# Patient Record
Sex: Female | Born: 1995 | Race: Black or African American | Hispanic: No | Smoking: Never smoker
Health system: Southern US, Community
[De-identification: ages and names within clinical notes are randomized; demographics above are authoritative.]

## PROBLEM LIST (undated history)

## (undated) ENCOUNTER — Inpatient Hospital Stay (HOSPITAL_COMMUNITY): Payer: Self-pay

## (undated) DIAGNOSIS — O139 Gestational [pregnancy-induced] hypertension without significant proteinuria, unspecified trimester: Secondary | ICD-10-CM

## (undated) DIAGNOSIS — F419 Anxiety disorder, unspecified: Secondary | ICD-10-CM

## (undated) DIAGNOSIS — Z87820 Personal history of traumatic brain injury: Secondary | ICD-10-CM

## (undated) DIAGNOSIS — R87629 Unspecified abnormal cytological findings in specimens from vagina: Secondary | ICD-10-CM

## (undated) DIAGNOSIS — R519 Headache, unspecified: Secondary | ICD-10-CM

## (undated) HISTORY — PX: ARTHROSCOPIC REPAIR ACL: SUR80

## (undated) HISTORY — DX: Headache, unspecified: R51.9

## (undated) HISTORY — DX: Gestational (pregnancy-induced) hypertension without significant proteinuria, unspecified trimester: O13.9

## (undated) HISTORY — DX: Unspecified abnormal cytological findings in specimens from vagina: R87.629

## (undated) HISTORY — DX: Personal history of traumatic brain injury: Z87.820

---
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
## 2016-03-06 ENCOUNTER — Encounter: Payer: Self-pay | Admitting: Pediatrics

## 2016-06-13 ENCOUNTER — Other Ambulatory Visit (HOSPITAL_COMMUNITY)
Admission: RE | Admit: 2016-06-13 | Discharge: 2016-06-13 | Disposition: A | Discharge status: Home / Self Care | Payer: Self-pay | Source: Ambulatory Visit | Attending: Obstetrics & Gynecology | Admitting: Obstetrics & Gynecology

## 2016-06-13 ENCOUNTER — Ambulatory Visit (INDEPENDENT_AMBULATORY_CARE_PROVIDER_SITE_OTHER): Payer: Medicaid Other | Admitting: Obstetrics & Gynecology

## 2016-06-13 ENCOUNTER — Encounter: Payer: Self-pay | Admitting: Obstetrics & Gynecology

## 2016-06-13 VITALS — BP 120/80 | HR 80 | Ht 62.0 in | Wt 165.0 lb

## 2016-06-13 DIAGNOSIS — Z308 Encounter for other contraceptive management: Secondary | ICD-10-CM

## 2016-06-13 DIAGNOSIS — Z113 Encounter for screening for infections with a predominantly sexual mode of transmission: Secondary | ICD-10-CM | POA: Insufficient documentation

## 2016-06-13 DIAGNOSIS — Z01419 Encounter for gynecological examination (general) (routine) without abnormal findings: Secondary | ICD-10-CM

## 2016-06-13 DIAGNOSIS — Z3202 Encounter for pregnancy test, result negative: Secondary | ICD-10-CM

## 2016-06-13 LAB — POCT URINE PREGNANCY: Preg Test, Ur: NEGATIVE

## 2016-06-13 MED ORDER — FLUCONAZOLE 150 MG PO TABS: 150.0000 mg | ORAL_TABLET | Freq: Once | ORAL | 0 refills | Status: AC

## 2016-06-13 NOTE — Progress Notes (Signed)
Subjective:     Lauren Guzman is a 20 y.o. female here for a routine exam.  Patient's last menstrual period was 05/23/2016. No obstetric history on file. Birth Control Method:  none Menstrual Calendar(currently): regular  Current complaints: none.   Current acute medical issues:  none   Recent Gynecologic History Patient's last menstrual period was 05/23/2016. Last Pap: never,   Last mammogram: ,    History reviewed. No pertinent past medical history.  History reviewed. No pertinent surgical history.  OB History    No data available      Social History   Social History  . Marital status: Single    Spouse name: N/A  . Number of children: N/A  . Years of education: N/A   Social History Main Topics  . Smoking status: Never Smoker  . Smokeless tobacco: Never Used  . Alcohol use No  . Drug use: No  . Sexual activity: Yes   Other Topics Concern  . None   Social History Narrative  . None    Family History  Problem Relation Age of Onset  . Diabetes Father   . Hypertension Father      Current Outpatient Prescriptions:  .  fluconazole (DIFLUCAN) 150 MG tablet, Take 1 tablet (150 mg total) by mouth once. Take the second tablet 3 days after the first one., Disp: 2 tablet, Rfl: 0  Review of Systems  Review of Systems  Constitutional: Negative for fever, chills, weight loss, malaise/fatigue and diaphoresis.  HENT: Negative for hearing loss, ear pain, nosebleeds, congestion, sore throat, neck pain, tinnitus and ear discharge.   Eyes: Negative for blurred vision, double vision, photophobia, pain, discharge and redness.  Respiratory: Negative for cough, hemoptysis, sputum production, shortness of breath, wheezing and stridor.   Cardiovascular: Negative for chest pain, palpitations, orthopnea, claudication, leg swelling and PND.  Gastrointestinal: negative for abdominal pain. Negative for heartburn, nausea, vomiting, diarrhea, constipation, blood in stool and melena.   Genitourinary: Negative for dysuria, urgency, frequency, hematuria and flank pain.  Musculoskeletal: Negative for myalgias, back pain, joint pain and falls.  Skin: Negative for itching and rash.  Neurological: Negative for dizziness, tingling, tremors, sensory change, speech change, focal weakness, seizures, loss of consciousness, weakness and headaches.  Endo/Heme/Allergies: Negative for environmental allergies and polydipsia. Does not bruise/bleed easily.  Psychiatric/Behavioral: Negative for depression, suicidal ideas, hallucinations, memory loss and substance abuse. The patient is not nervous/anxious and does not have insomnia.        Objective:  Blood pressure 120/80, pulse 80, height 5\' 2"  (1.575 m), weight 165 lb (74.8 kg), last menstrual period 05/23/2016.   Physical Exam  Vitals reviewed. Constitutional: She is oriented to person, place, and time. She appears well-developed and well-nourished.  HENT:  Head: Normocephalic and atraumatic.        Right Ear: External ear normal.  Left Ear: External ear normal.  Nose: Nose normal.  Mouth/Throat: Oropharynx is clear and moist.  Eyes: Conjunctivae and EOM are normal. Pupils are equal, round, and reactive to light. Right eye exhibits no discharge. Left eye exhibits no discharge. No scleral icterus.  Neck: Normal range of motion. Neck supple. No tracheal deviation present. No thyromegaly present.  Cardiovascular: Normal rate, regular rhythm, normal heart sounds and intact distal pulses.  Exam reveals no gallop and no friction rub.   No murmur heard. Respiratory: Effort normal and breath sounds normal. No respiratory distress. She has no wheezes. She has no rales. She exhibits no tenderness.  GI: Soft.  Bowel sounds are normal. She exhibits no distension and no mass. There is no tenderness. There is no rebound and no guarding.  Genitourinary:  Breasts no masses skin changes or nipple changes bilaterally      Vulva is normal without  lesions Vagina is pink moist with yeast vaginitis Cervix normal in appearance and pap is done Uterus is normal size shape and contour Adnexa is negative with normal sized ovaries   Musculoskeletal: Normal range of motion. She exhibits no edema and no tenderness.  Neurological: She is alert and oriented to person, place, and time. She has normal reflexes. She displays normal reflexes. No cranial nerve deficit. She exhibits normal muscle tone. Coordination normal.  Skin: Skin is warm and dry. No rash noted. No erythema. No pallor.  Psychiatric: She has a normal mood and affect. Her behavior is normal. Judgment and thought content normal.       Medications Ordered at today's visit: Meds ordered this encounter  Medications  . fluconazole (DIFLUCAN) 150 MG tablet    Sig: Take 1 tablet (150 mg total) by mouth once. Take the second tablet 3 days after the first one.    Dispense:  2 tablet    Refill:  0    Other orders placed at today's visit: Orders Placed This Encounter  Procedures  . POCT urine pregnancy      Assessment:    Healthy female exam.   yeast vaginitis Plan:    we discussed birth control options at some length, she has done Depo and pills before but wants the ease of use of depo.  We discussed Nexplanon and she had not heard of that before.  She likes the idea of 3 year coverage, as she is a Holiday representative in college  Will order nexplanon and pt will call on her menses to schedule, she is free from college on Fridays She is instructed not to have intercourse after she starts her next period until the nexplanon     Return if symptoms worsen or fail to improve, for pt will call on her menses to have nexplanon placed, with Dr Despina Hidden.

## 2016-06-13 NOTE — Addendum Note (Signed)
Addended by: Federico FlakeNES, Sophea Rackham A on: 06/13/2016 11:47 AM   Modules accepted: Orders

## 2016-06-13 NOTE — Addendum Note (Signed)
Addended by: Federico FlakeNES, Nabilah Davoli A on: 06/13/2016 11:53 AM   Modules accepted: Orders

## 2016-06-17 LAB — CYTOLOGY - PAP

## 2016-07-09 ENCOUNTER — Ambulatory Visit (INDEPENDENT_AMBULATORY_CARE_PROVIDER_SITE_OTHER): Payer: Medicaid Other | Admitting: Women's Health

## 2016-07-09 ENCOUNTER — Other Ambulatory Visit: Payer: Medicaid Other

## 2016-07-09 ENCOUNTER — Encounter: Payer: Self-pay | Admitting: Women's Health

## 2016-07-09 VITALS — BP 118/62 | HR 72 | Wt 172.0 lb

## 2016-07-09 DIAGNOSIS — Z308 Encounter for other contraceptive management: Secondary | ICD-10-CM | POA: Diagnosis not present

## 2016-07-09 DIAGNOSIS — Z30017 Encounter for initial prescription of implantable subdermal contraceptive: Secondary | ICD-10-CM | POA: Insufficient documentation

## 2016-07-09 LAB — BETA HCG QUANT (REF LAB)

## 2016-07-09 NOTE — Progress Notes (Signed)
[name] is a 20 y.o. year old [place] female here for Nexplanon insertion.  Patient's last menstrual period was 06/23/2016., last sexual intercourse was 06/16/16, and her pregnancy test today was negative.  Risks/benefits/side effects of Nexplanon have been discussed and her questions have been answered.  Specifically, a failure rate of 09/998 has been reported, with an increased failure rate if pt takes St. John's Wort and/or antiseizure medicaitons.  [name] is aware of the common side effect of irregular bleeding, which the incidence of decreases over time.  BP 118/62 (BP Location: Right Arm, Patient Position: Sitting, Cuff Size: Normal)   Pulse 72   Wt 172 lb (78 kg)   LMP 06/23/2016   BMI 31.46 kg/m   Results for orders placed or performed in visit on 07/09/16 (from the past 24 hour(s))  Beta HCG, Quant   Collection Time: 07/09/16  9:02 AM  Result Value Ref Range   hCG Quant <1 mIU/mL     She is right-handed, so her left arm, approximately 4 inches proximal from the elbow, was cleansed with alcohol and anesthetized with 2cc of 2% Lidocaine.  The area was cleansed again with betadine and the Nexplanon was inserted per manufacturer's recommendations without difficulty.  A steri-strip and pressure bandage were applied.  Pt was instructed to keep the area clean and dry, remove pressure bandage in 24 hours, and keep insertion site covered with the steri-strip for 3-5 days.  Back up contraception was recommended for 2 weeks.  She was given a card indicating date Nexplanon was inserted and date it needs to be removed. Follow-up PRN problems, then when turns 21yo for pap & physical. Condoms always for STI prevention.   Marge DuncansBooker, Sena Clouatre Randall CNM, [facility]NP-BC 07/09/2016 3:56 PM

## 2016-07-09 NOTE — Patient Instructions (Signed)

## 2017-03-08 DIAGNOSIS — Z87820 Personal history of traumatic brain injury: Secondary | ICD-10-CM

## 2017-03-08 HISTORY — DX: Personal history of traumatic brain injury: Z87.820

## 2017-03-22 ENCOUNTER — Emergency Department (HOSPITAL_COMMUNITY)
Admission: EM | Admit: 2017-03-22 | Discharge: 2017-03-22 | Disposition: A | Discharge status: Home / Self Care | Payer: No Typology Code available for payment source | Attending: Emergency Medicine | Admitting: Emergency Medicine

## 2017-03-22 ENCOUNTER — Encounter (HOSPITAL_COMMUNITY): Payer: Self-pay

## 2017-03-22 DIAGNOSIS — Y999 Unspecified external cause status: Secondary | ICD-10-CM | POA: Insufficient documentation

## 2017-03-22 DIAGNOSIS — Y9389 Activity, other specified: Secondary | ICD-10-CM | POA: Insufficient documentation

## 2017-03-22 DIAGNOSIS — Y92410 Unspecified street and highway as the place of occurrence of the external cause: Secondary | ICD-10-CM | POA: Diagnosis not present

## 2017-03-22 DIAGNOSIS — G44319 Acute post-traumatic headache, not intractable: Secondary | ICD-10-CM | POA: Diagnosis not present

## 2017-03-22 DIAGNOSIS — R51 Headache: Secondary | ICD-10-CM | POA: Diagnosis present

## 2017-03-22 MED ORDER — METHOCARBAMOL 500 MG PO TABS
500.0000 mg | ORAL_TABLET | Freq: Once | ORAL | Status: AC
  Administered 2017-03-22: 500 mg via ORAL
  Filled 2017-03-22: qty 1

## 2017-03-22 MED ORDER — METHOCARBAMOL 500 MG PO TABS: 500.0000 mg | ORAL_TABLET | Freq: Two times a day (BID) | ORAL | 0 refills | Status: DC

## 2017-03-22 MED ORDER — KETOROLAC TROMETHAMINE 60 MG/2ML IM SOLN
60.0000 mg | Freq: Once | INTRAMUSCULAR | Status: AC
  Administered 2017-03-22: 60 mg via INTRAMUSCULAR
  Filled 2017-03-22: qty 2

## 2017-03-22 MED ORDER — NAPROXEN 500 MG PO TABS: 500.0000 mg | ORAL_TABLET | Freq: Two times a day (BID) | ORAL | 0 refills | Status: DC
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
## 2017-03-22 NOTE — ED Notes (Signed)
Bed: WTR7 Expected date:  Expected time:  Means of arrival:  Comments: 

## 2017-03-22 NOTE — Discharge Instructions (Signed)
1. Medications: Alternate naprosyn and Tylenol for pain, robaxin 2. Treatment: Rest, ice on head.  Concussion precautions given - keep patient in a quiet, not simulating, dark environment. No TV, computer use, video games until headache is resolved completely. No contact sports until cleared by PCP. 3. Follow Up: With primary care physician on Monday if headache persists.  Return to the emergency department if patient becomes lethargic, begins vomiting, develops double vision, speech difficulty, problems walking or other change in mental status.

## 2017-03-25 ENCOUNTER — Emergency Department (HOSPITAL_COMMUNITY)
Admission: EM | Admit: 2017-03-25 | Discharge: 2017-03-25 | Disposition: A | Discharge status: Home / Self Care | Payer: No Typology Code available for payment source | Attending: Emergency Medicine | Admitting: Emergency Medicine

## 2017-03-25 ENCOUNTER — Encounter (HOSPITAL_COMMUNITY): Payer: Self-pay | Admitting: Emergency Medicine

## 2017-03-25 ENCOUNTER — Emergency Department (HOSPITAL_COMMUNITY): Payer: No Typology Code available for payment source

## 2017-03-25 DIAGNOSIS — R42 Dizziness and giddiness: Secondary | ICD-10-CM | POA: Diagnosis not present

## 2017-03-25 DIAGNOSIS — F0781 Postconcussional syndrome: Secondary | ICD-10-CM | POA: Diagnosis not present

## 2017-03-25 DIAGNOSIS — R51 Headache: Secondary | ICD-10-CM | POA: Insufficient documentation

## 2017-03-25 MED ORDER — DIPHENHYDRAMINE HCL 50 MG/ML IJ SOLN
25.0000 mg | Freq: Once | INTRAMUSCULAR | Status: AC
  Administered 2017-03-25: 25 mg via INTRAMUSCULAR
  Filled 2017-03-25: qty 1

## 2017-03-25 MED ORDER — METOCLOPRAMIDE HCL 5 MG/ML IJ SOLN
10.0000 mg | Freq: Once | INTRAMUSCULAR | Status: AC
  Administered 2017-03-25: 10 mg via INTRAMUSCULAR
  Filled 2017-03-25: qty 2

## 2017-03-25 MED ORDER — KETOROLAC TROMETHAMINE 30 MG/ML IJ SOLN
30.0000 mg | Freq: Once | INTRAMUSCULAR | Status: AC
  Administered 2017-03-25: 30 mg via INTRAMUSCULAR
  Filled 2017-03-25: qty 1
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
## 2017-03-28 ENCOUNTER — Encounter (HOSPITAL_COMMUNITY): Payer: Self-pay

## 2017-03-28 ENCOUNTER — Emergency Department (HOSPITAL_COMMUNITY)
Admission: EM | Admit: 2017-03-28 | Discharge: 2017-03-28 | Disposition: A | Discharge status: Home / Self Care | Payer: No Typology Code available for payment source | Attending: Emergency Medicine | Admitting: Emergency Medicine

## 2017-03-28 DIAGNOSIS — R200 Anesthesia of skin: Secondary | ICD-10-CM | POA: Insufficient documentation

## 2017-03-28 DIAGNOSIS — R51 Headache: Secondary | ICD-10-CM | POA: Diagnosis present

## 2017-03-28 DIAGNOSIS — G44309 Post-traumatic headache, unspecified, not intractable: Secondary | ICD-10-CM | POA: Diagnosis not present

## 2017-03-28 DIAGNOSIS — Z79899 Other long term (current) drug therapy: Secondary | ICD-10-CM | POA: Diagnosis not present

## 2017-03-28 MED ORDER — DIPHENHYDRAMINE HCL 50 MG/ML IJ SOLN
25.0000 mg | Freq: Once | INTRAMUSCULAR | Status: AC
  Administered 2017-03-28: 25 mg via INTRAVENOUS
  Filled 2017-03-28: qty 1

## 2017-03-28 MED ORDER — KETOROLAC TROMETHAMINE 30 MG/ML IJ SOLN
30.0000 mg | Freq: Once | INTRAMUSCULAR | Status: DC
  Filled 2017-03-28: qty 1

## 2017-03-28 MED ORDER — METOCLOPRAMIDE HCL 5 MG/ML IJ SOLN
10.0000 mg | Freq: Once | INTRAMUSCULAR | Status: AC
  Administered 2017-03-28: 10 mg via INTRAVENOUS
  Filled 2017-03-28: qty 2

## 2017-03-28 MED ORDER — KETOROLAC TROMETHAMINE 30 MG/ML IJ SOLN
30.0000 mg | Freq: Once | INTRAMUSCULAR | Status: AC
  Administered 2017-03-28: 30 mg via INTRAVENOUS

## 2017-03-28 NOTE — ED Notes (Signed)
ED Provider at bedside. 
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
## 2017-05-18 ENCOUNTER — Encounter: Payer: Self-pay | Admitting: Neurology

## 2017-05-18 ENCOUNTER — Ambulatory Visit (INDEPENDENT_AMBULATORY_CARE_PROVIDER_SITE_OTHER): Payer: Self-pay | Admitting: Neurology

## 2017-05-18 DIAGNOSIS — G43709 Chronic migraine without aura, not intractable, without status migrainosus: Secondary | ICD-10-CM

## 2017-05-18 DIAGNOSIS — IMO0002 Reserved for concepts with insufficient information to code with codable children: Secondary | ICD-10-CM | POA: Insufficient documentation

## 2017-05-18 MED ORDER — SUMATRIPTAN SUCCINATE 50 MG PO TABS: 50.0000 mg | ORAL_TABLET | ORAL | 6 refills | Status: DC | PRN
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
## 2017-05-18 NOTE — Addendum Note (Signed)
Addended by: Levert FeinsteinYAN, Cortny Bambach on: 05/18/2017 09:38 AM   Modules accepted: Level of Service

## 2017-11-10 ENCOUNTER — Ambulatory Visit: Payer: Self-pay

## 2017-11-10 ENCOUNTER — Other Ambulatory Visit: Payer: Self-pay | Admitting: Occupational Medicine

## 2017-11-10 DIAGNOSIS — M25562 Pain in left knee: Secondary | ICD-10-CM

## 2017-11-12 ENCOUNTER — Encounter (HOSPITAL_COMMUNITY): Payer: Self-pay | Admitting: Emergency Medicine

## 2017-11-12 ENCOUNTER — Emergency Department (HOSPITAL_COMMUNITY)
Admission: EM | Admit: 2017-11-12 | Discharge: 2017-11-12 | Disposition: A | Discharge status: Home / Self Care | Payer: BLUE CROSS/BLUE SHIELD | Attending: Emergency Medicine | Admitting: Emergency Medicine

## 2017-11-12 ENCOUNTER — Other Ambulatory Visit: Payer: Self-pay

## 2017-11-12 DIAGNOSIS — S8992XD Unspecified injury of left lower leg, subsequent encounter: Secondary | ICD-10-CM | POA: Diagnosis not present

## 2017-11-12 DIAGNOSIS — X500XXD Overexertion from strenuous movement or load, subsequent encounter: Secondary | ICD-10-CM | POA: Insufficient documentation

## 2017-11-12 MED ORDER — IBUPROFEN 600 MG PO TABS: 600.0000 mg | ORAL_TABLET | Freq: Four times a day (QID) | ORAL | 0 refills | Status: DC | PRN

## 2017-11-12 NOTE — Discharge Instructions (Signed)
As discussed,  I suspect your may have a meniscal injury of your knee (cartilage) which usually will heal on its own but can take some time.  Continue to avoid weight bearing until pain allows and avoid excessive bending of the knee (definitely not more than 90 degrees), use ice and elevation which can also help with swelling.

## 2017-11-12 NOTE — ED Triage Notes (Signed)
Patient c/o pain and swelling to left knee. Per patient injured knee ant work Monday night. [facility], had x-ray (no fractures). Patient given knee brace and crutches. Patient taking over-the-counter medication. Per patient swelling intermittently-getting worse, unable to bear weight. Denies changes in temp-CNS intact.

## 2017-11-13 NOTE — ED Provider Notes (Signed)
[facility] EMERGENCY DEPARTMENT Provider Note   CSN: [id number] Arrival date & time: 11/12/17  1121     History   Chief Complaint Chief Complaint  Patient presents with  . Knee Injury    HPI Lauren Guzman is a 22 y.o. female presenting with persistent pain and worsened swelling and a tight feeling in the left knee since she injured the knee jumping off a conveyor belt at work 3 days ago.  She had a twisting injury when she landed and has had increasing difficulty weight bearing since the event.  She was seen by [facility] 2 days ago during which time she had negative knee xrays.  She reports she was not given a diagnosis and got concerned when she woke today with more swelling and decreased ability to bear weight.  She denies radiation of pain.  She was provided a knee immobilizer and crutches which she is utilizing. She has taken a few advil, but "doesn't like to take pills".    HPI  Past Medical History:  Diagnosis Date  . History of concussion 03/2017    Patient Active Problem List   Diagnosis Date Noted  . Chronic migraine 05/18/2017  . Nexplanon insertion 07/09/2016    History reviewed. No pertinent surgical history.  OB History    No data available       Home Medications    Prior to Admission medications   Medication Sig Start Date End Date Taking? Authorizing Provider  etonogestrel (NEXPLANON) 68 MG IMPL implant 1 each by Subdermal route once.    [provider]  ibuprofen (ADVIL,MOTRIN) 600 MG tablet Take 1 tablet (600 mg total) by mouth every 6 (six) hours as needed. 11/12/17   Burgess Amor, PA-C  methocarbamol (ROBAXIN) 500 MG tablet Take 1 tablet (500 mg total) by mouth 2 (two) times daily. 03/22/17   Muthersbaugh, Dahlia Client, PA-C  naproxen (NAPROSYN) 500 MG tablet Take 1 tablet (500 mg total) by mouth 2 (two) times daily with a meal. 03/22/17   Muthersbaugh, Dahlia Client, PA-C  SUMAtriptan (IMITREX) 50 MG tablet Take 1 tablet (50 mg total) by mouth every 2  (two) hours as needed for migraine. May repeat in 2 hours if headache persists or recurs. 05/18/17   Levert Feinstein, MD    Family History Family History  Problem Relation Age of Onset  . Diabetes Father   . Hypertension Father   . Hypertension Mother   . Diabetes Maternal Grandfather   . Lung cancer Maternal Grandfather   . Prostate cancer Maternal Grandfather   . Colon cancer Maternal Grandfather   . Diabetes Paternal Grandmother   . Diabetes Paternal Grandfather     Social History Social History   Tobacco Use  . Smoking status: Never Smoker  . Smokeless tobacco: Never Used  Substance Use Topics  . Alcohol use: No  . Drug use: No     Allergies   Kiwi extract   Review of Systems Review of Systems  Constitutional: Negative for fever.  Musculoskeletal: Positive for arthralgias and joint swelling. Negative for myalgias.  Neurological: Negative for weakness and numbness.     Physical Exam Updated Vital Signs BP 128/80 (BP Location: Right Arm)   Pulse (!) 110   Temp 98.4 F (36.9 C) (Oral)   Resp 18   Ht 5\' 2"  (1.575 m)   Wt 83.5 kg (184 lb)   SpO2 100%   BMI 33.65 kg/m   Physical Exam  Constitutional: She appears well-developed and well-nourished.  HENT:  Head: Atraumatic.  Neck: Normal range of motion.  Cardiovascular:  Pulses equal bilaterally  Musculoskeletal: She exhibits edema and tenderness.       Left knee: She exhibits decreased range of motion, swelling and effusion. She exhibits no ecchymosis, no erythema, normal alignment, no LCL laxity and no MCL laxity. No medial joint line, no lateral joint line, no MCL and no LCL tenderness noted.  Pain medially but not laxity with valgus strain.  Mild crepitus with ROM which is limited due to generalized swelling.  Possible small effusion.  Neurological: She is alert. She has normal strength. She displays normal reflexes. No sensory deficit.  Skin: Skin is warm and dry.  Psychiatric: She has a normal mood and  affect.     ED Treatments / Results  Labs (all labs ordered are listed, but only abnormal results are displayed) Labs Reviewed - No data to display  EKG  EKG Interpretation None       Radiology No results found.  Procedures Procedures (including critical care time)  Medications Ordered in ED Medications - No data to display   Initial Impression / Assessment and Plan / ED Course  I have reviewed the triage vital signs and the nursing notes.  Pertinent labs & imaging results that were available during my care of the patient were reviewed by me and considered in my medical decision making (see chart for details).     Pt with work related left knee injury with increased edema and pain.  Exam favors meniscal injury, ligaments intact, knee joint stable.  Advised RICE, encouraged her to take ibuprofen scheduled which will help with pain and swelling.  Discussed structures of the knee and prob diagnosis which can heal with time and rest, +/- steroid injection.  May need MRI if these tx do not improve sx.  She has f/u with [facility] in one week. Encouraged to keep this appt.  Final Clinical Impressions(s) / ED Diagnoses   Final diagnoses:  Injury of left knee, subsequent encounter    ED Discharge Orders        Ordered    ibuprofen (ADVIL,MOTRIN) 600 MG tablet  Every 6 hours PRN     11/12/17 1446       Burgess Amordol, Sage Hammill, PA-C 11/13/17 [id number]    Donnetta Hutchingook, Brian, MD 11/14/17 [phone]

## 2018-05-15 IMAGING — CT CT HEAD W/O CM
3 series · 16 of 47 positions shown, 19 images · non-contrast
Comparison: None.

CLINICAL DATA: 20-year-old female with headache and dizziness.

EXAM:
CT HEAD WITHOUT CONTRAST
TECHNIQUE: Contiguous axial images were obtained from the base of the skull
through the vertex without intravenous contrast.

[Series 2: head trauma wo · axial · 0.39mm/px · z∈[+241,+366]mm · 10 of 30 slices shown, 13 images]
[im 3/30  brain]
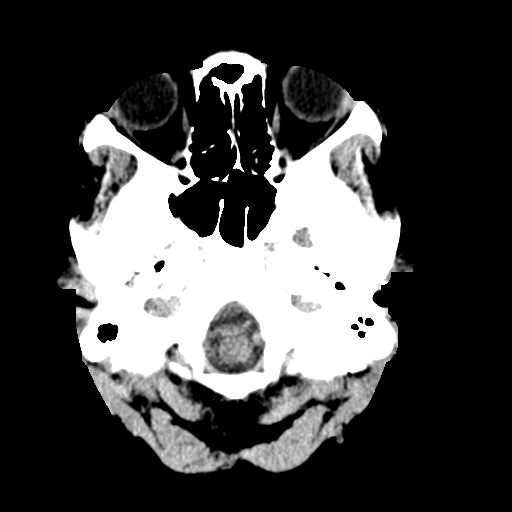
[im 3/30  bone]
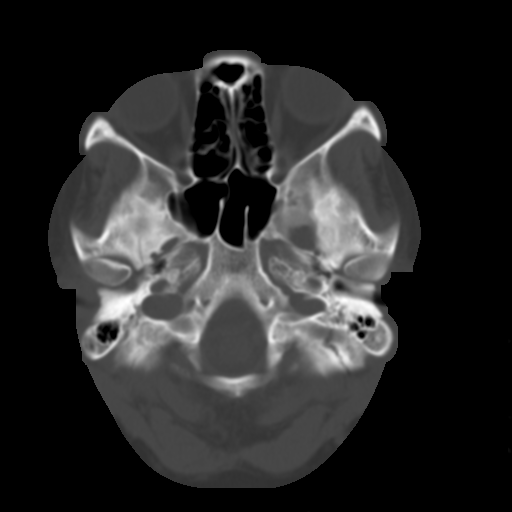
[im 6/30  brain]
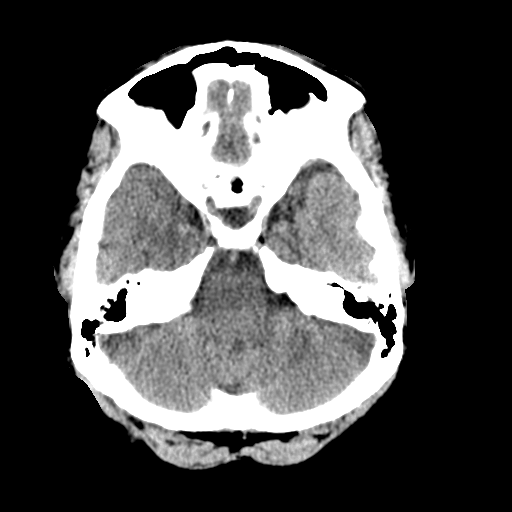
[im 9/30  brain]
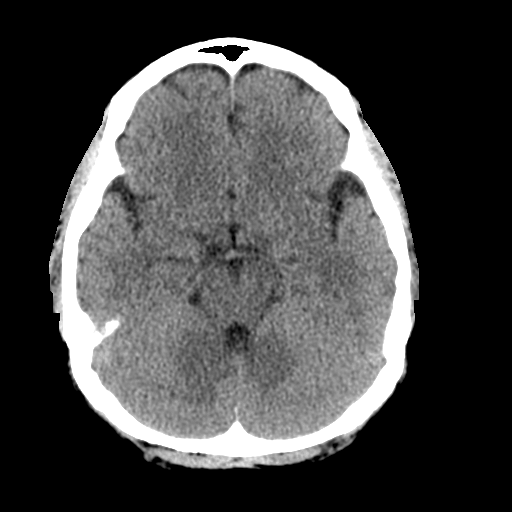
[im 11/30  brain]
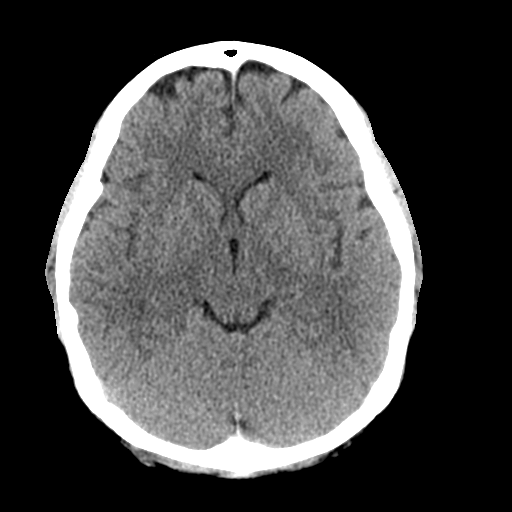
[im 14/30  brain]
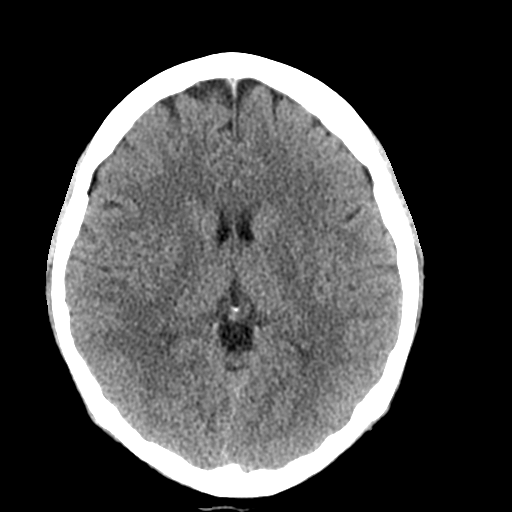
[im 14/30  bone]
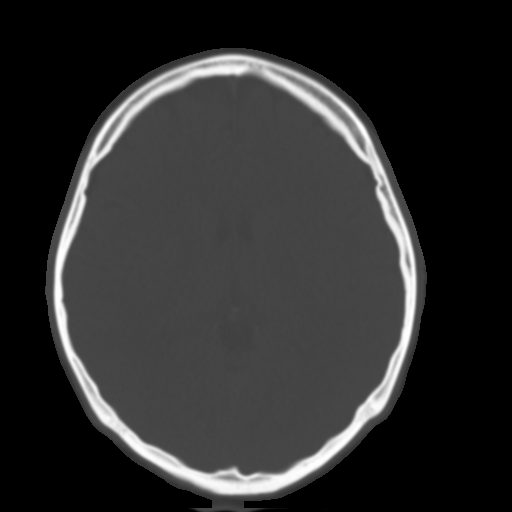
[im 17/30  brain]
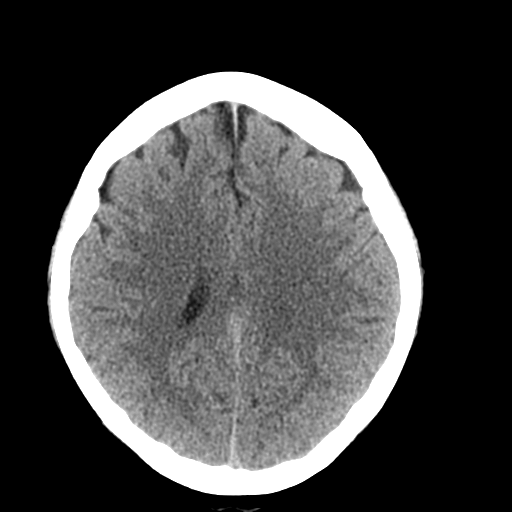
[im 20/30  brain]
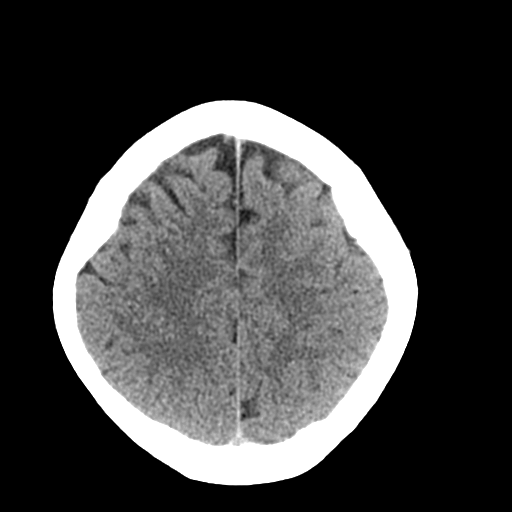
[im 23/30  brain]
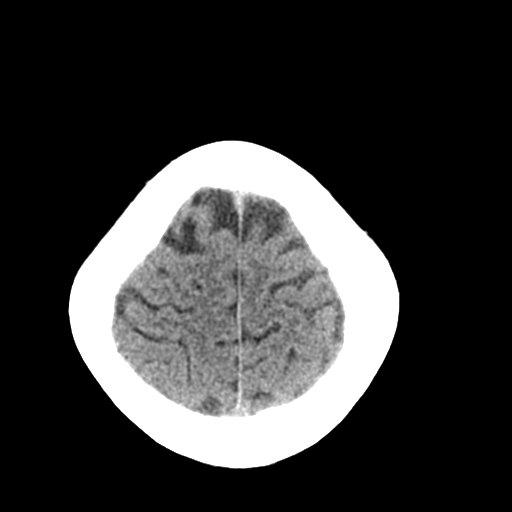
[im 25/30  brain]
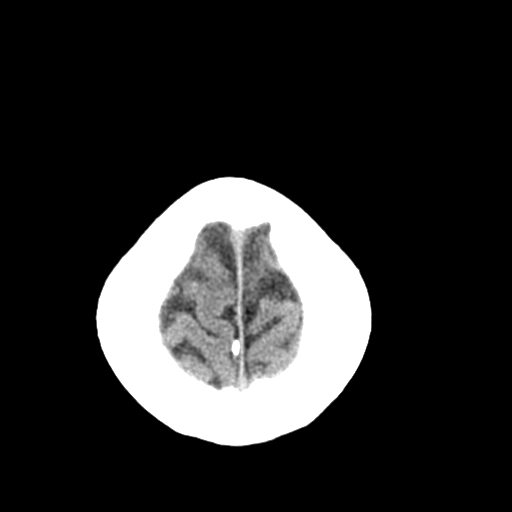
[im 25/30  bone]
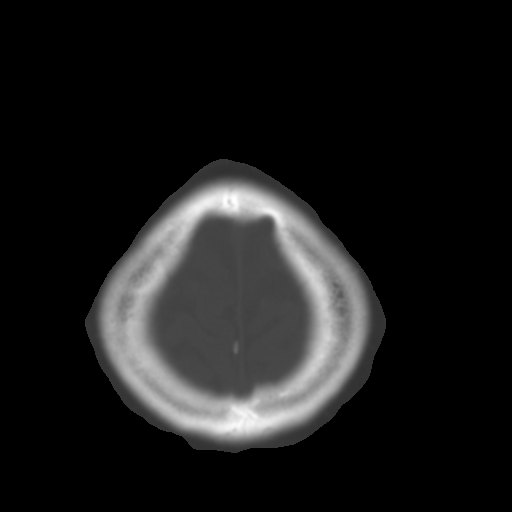
[im 28/30  brain]
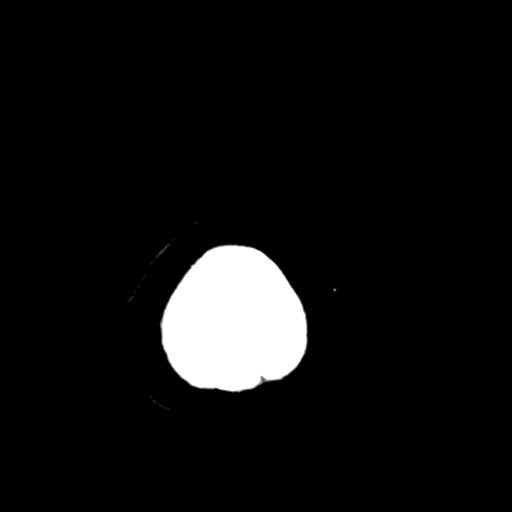

[Series 4: coronal soft tissue · coronal · 0.30mm/px · 3 of 63 slices shown]
[im 21/63  brain]
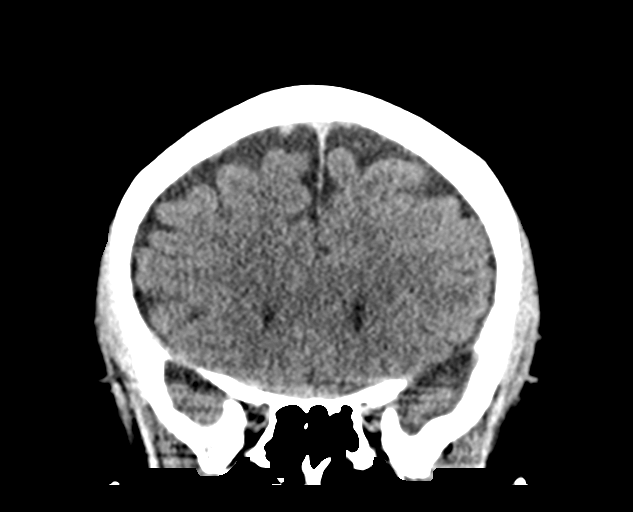
[im 28/63  brain]
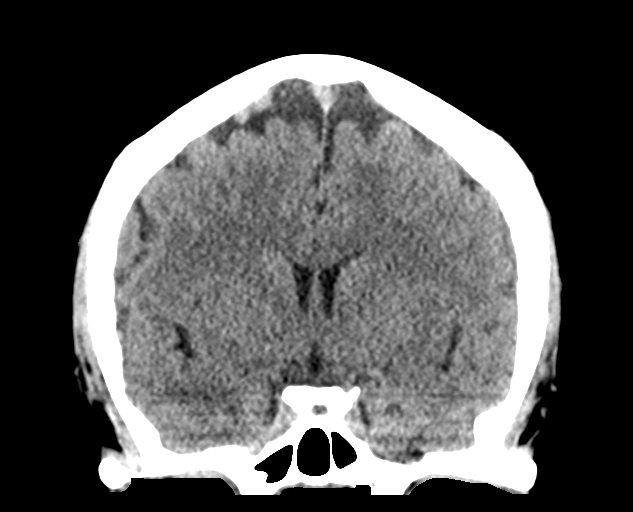
[im 35/63  brain]
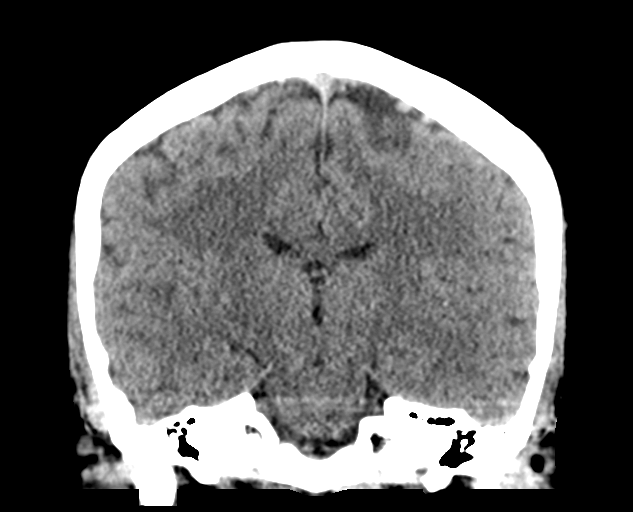

[Series 5: sagittal soft tissue · sagittal · 0.33mm/px · 3 of 55 slices shown]
[im 19/55  brain]
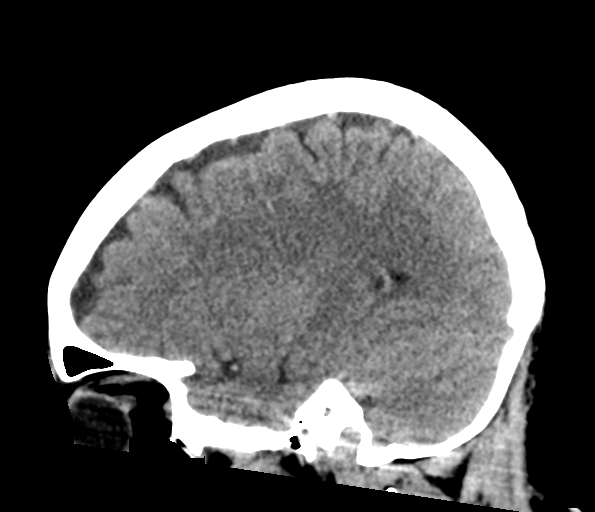
[im 28/55  brain]
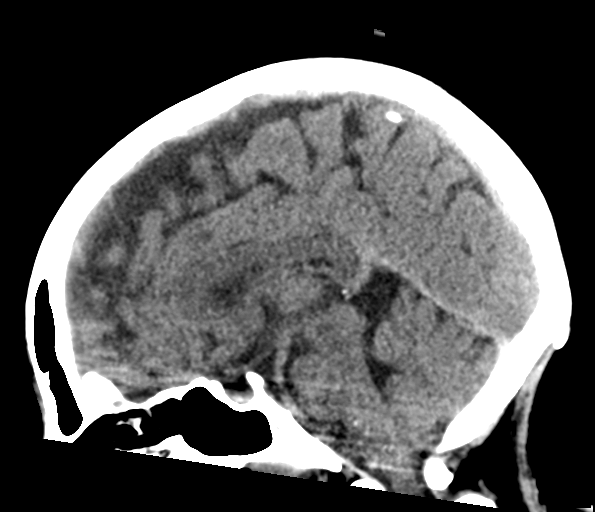
[im 37/55  brain]
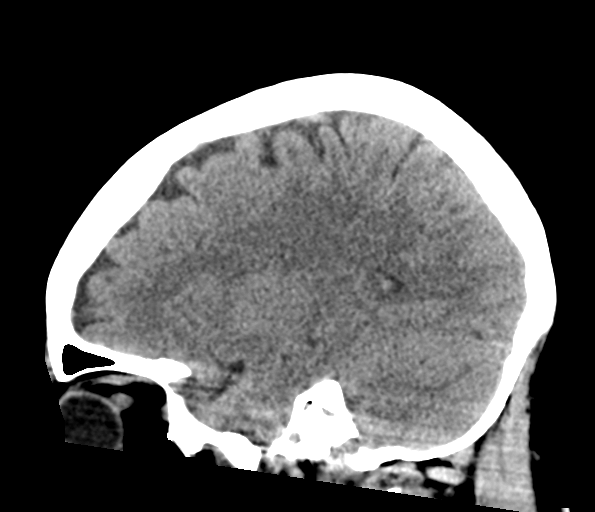

[16 of 47 positions shown; findings below may reference images not displayed]

FINDINGS: Brain: No evidence of acute infarction, hemorrhage, hydrocephalus,
extra-axial collection or mass lesion/mass effect.

Vascular: No hyperdense vessel or unexpected calcification.

Skull: Normal. Negative for fracture or focal lesion.

Sinuses/Orbits: No acute finding.

Other: None
IMPRESSION: Normal noncontrast CT of the brain.

## 2019-02-07 ENCOUNTER — Other Ambulatory Visit: Payer: Self-pay

## 2019-02-07 ENCOUNTER — Encounter (HOSPITAL_COMMUNITY): Payer: Self-pay

## 2019-02-07 ENCOUNTER — Ambulatory Visit (HOSPITAL_COMMUNITY)
Admission: EM | Admit: 2019-02-07 | Discharge: 2019-02-07 | Disposition: A | Discharge status: Home / Self Care | Payer: BLUE CROSS/BLUE SHIELD | Attending: Family Medicine | Admitting: Family Medicine

## 2019-02-07 DIAGNOSIS — Z113 Encounter for screening for infections with a predominantly sexual mode of transmission: Secondary | ICD-10-CM

## 2019-02-07 DIAGNOSIS — B373 Candidiasis of vulva and vagina: Secondary | ICD-10-CM | POA: Insufficient documentation

## 2019-02-07 DIAGNOSIS — Z3202 Encounter for pregnancy test, result negative: Secondary | ICD-10-CM

## 2019-02-07 DIAGNOSIS — Z711 Person with feared health complaint in whom no diagnosis is made: Secondary | ICD-10-CM | POA: Insufficient documentation

## 2019-02-07 DIAGNOSIS — B3731 Acute candidiasis of vulva and vagina: Secondary | ICD-10-CM

## 2019-02-07 LAB — POCT PREGNANCY, URINE: Preg Test, Ur: NEGATIVE

## 2019-02-07 MED ORDER — FLUCONAZOLE 200 MG PO TABS: 200.0000 mg | ORAL_TABLET | Freq: Once | ORAL | 0 refills | Status: AC

## 2019-02-07 NOTE — ED Provider Notes (Signed)
[facility]    CSN: [id number] Arrival date & time: 02/07/19  8309     History   Chief Complaint Chief Complaint  Patient presents with  . Vaginal Discharge    HPI Lauren Guzman is a 23 y.o. female for acute concern of vaginal itching and discharge.  Patient has history of yeast infections, states this is similar to previous flares.  Patient states that she has had symptoms for 2 to 3 weeks now.  Endorses vulvovaginal burning, itching, and white, thick discharge.  Patient denies burning/stinging with urination, urinary frequency, vaginal/pelvic pain, abdominal pain, flank pain, irregular vaginal bleeding.  Patient currently using Nexplanon for contraception, due for removal in November.  Patient wondering if she can be screened for STDs due to new sexual partner.  Denies history of HIV syphilis.   Past Medical History:  Diagnosis Date  . History of concussion 03/2017    Patient Active Problem List   Diagnosis Date Noted  . Chronic migraine 05/18/2017  . Nexplanon insertion 07/09/2016    History reviewed. No pertinent surgical history.  OB History   No obstetric history on file.      Home Medications    Prior to Admission medications   Medication Sig Start Date End Date Taking? Authorizing Provider  etonogestrel (NEXPLANON) 68 MG IMPL implant 1 each by Subdermal route once.    [provider]  fluconazole (DIFLUCAN) 200 MG tablet Take 1 tablet (200 mg total) by mouth once for 1 dose. May repeat in 72 hours if needed 02/07/19 02/07/19  Hall-Potvin, [place], PA-C  ibuprofen (ADVIL,MOTRIN) 600 MG tablet Take 1 tablet (600 mg total) by mouth every 6 (six) hours as needed. 11/12/17   Burgess Amor, PA-C  methocarbamol (ROBAXIN) 500 MG tablet Take 1 tablet (500 mg total) by mouth 2 (two) times daily. 03/22/17   Muthersbaugh, Dahlia Client, PA-C  naproxen (NAPROSYN) 500 MG tablet Take 1 tablet (500 mg total) by mouth 2 (two) times daily with a meal. 03/22/17   Muthersbaugh,  Dahlia Client, PA-C  SUMAtriptan (IMITREX) 50 MG tablet Take 1 tablet (50 mg total) by mouth every 2 (two) hours as needed for migraine. May repeat in 2 hours if headache persists or recurs. 05/18/17   Levert Feinstein, MD    Family History Family History  Problem Relation Age of Onset  . Diabetes Father   . Hypertension Father   . Hypertension Mother   . Diabetes Maternal Grandfather   . Lung cancer Maternal Grandfather   . Prostate cancer Maternal Grandfather   . Colon cancer Maternal Grandfather   . Diabetes Paternal Grandmother   . Diabetes Paternal Grandfather     Social History Social History   Tobacco Use  . Smoking status: Never Smoker  . Smokeless tobacco: Never Used  Substance Use Topics  . Alcohol use: No  . Drug use: No     Allergies   Kiwi extract   Review of Systems As per HPI   Physical Exam Triage Vital Signs ED Triage Vitals  Enc Vitals Group     BP 02/07/19 0940 140/80     Pulse Rate 02/07/19 0940 93     Resp 02/07/19 0940 18     Temp 02/07/19 0940 99.3 F (37.4 C)     Temp Source 02/07/19 0940 Oral     SpO2 02/07/19 0940 99 %     Weight 02/07/19 0939 209 lb (94.8 kg)     Height --      Head Circumference --  Peak Flow --      Pain Score --      Pain Loc --      Pain Edu? --      Excl. in GC? --    No data found.  Updated Vital Signs BP 140/80 (BP Location: Right Arm)   Pulse 93   Temp 99.3 F (37.4 C) (Oral)   Resp 18   Wt 209 lb (94.8 kg)   SpO2 99%   BMI 38.23 kg/m   Visual Acuity Right Eye Distance:   Left Eye Distance:   Bilateral Distance:    Right Eye Near:   Left Eye Near:    Bilateral Near:     Physical Exam Constitutional:      General: She is not in acute distress. HENT:     Head: Normocephalic and atraumatic.  Eyes:     General: No scleral icterus.    Pupils: Pupils are equal, round, and reactive to light.  Cardiovascular:     Rate and Rhythm: Normal rate.  Pulmonary:     Effort: Pulmonary effort is normal.   Abdominal:     General: Abdomen is flat. Bowel sounds are normal. There is no distension.     Palpations: Abdomen is soft.     Tenderness: There is no abdominal tenderness. There is no right CVA tenderness, left CVA tenderness or guarding.  Neurological:     Mental Status: She is alert and oriented to person, place, and time.      UC Treatments / Results  Labs (all labs ordered are listed, but only abnormal results are displayed) Labs Reviewed  HIV ANTIBODY (ROUTINE TESTING W REFLEX)  RPR  POC URINE PREG, ED  POCT PREGNANCY, URINE  CERVICOVAGINAL ANCILLARY ONLY    EKG None  Radiology No results found.  Procedures Procedures (including critical care time)  Medications Ordered in UC Medications - No data to display  Initial Impression / Assessment and Plan / UC Course  I have reviewed the triage vital signs and the nursing notes.  Pertinent labs & imaging results that were available during my care of the patient were reviewed by me and considered in my medical decision making (see chart for details).     23 year old female with history of yeast infections, presenting for similar concerns.  Will treat with Diflucan, cytology and STD screening pending.  Will treat if indicated.  Reviewed precautions with patient who verbalized understanding. Final Clinical Impressions(s) / UC Diagnoses   Final diagnoses:  Vaginal yeast infection  Concern about STD in female without diagnosis     Discharge Instructions     Take diflucan today, may repeat in 72 hours if needed. We will call you with your lab results and treat if indicated. Return if discharge or pain worsens, you develop fever, irregular bleeding.    ED Prescriptions    Medication Sig Dispense Auth. Provider   fluconazole (DIFLUCAN) 200 MG tablet Take 1 tablet (200 mg total) by mouth once for 1 dose. May repeat in 72 hours if needed 2 tablet Hall-Potvin, [place], PA-C     Controlled Substance Prescriptions  [place] Controlled Substance Registry consulted? Not Applicable   Shea EvansHall-Potvin, , [place]PA-C 02/07/19 1006

## 2019-02-07 NOTE — Discharge Instructions (Signed)
Take diflucan today, may repeat in 72 hours if needed. We will call you with your lab results and treat if indicated. Return if discharge or pain worsens, you develop fever, irregular bleeding.

## 2019-02-07 NOTE — ED Triage Notes (Signed)
Pt cc she has a vaginal discharge and she would like to be tested for STD's. Pt states the discharge started yesterday.

## 2019-02-08 LAB — RPR: RPR Ser Ql: NONREACTIVE

## 2019-02-08 LAB — HIV ANTIBODY (ROUTINE TESTING W REFLEX): HIV Screen 4th Generation wRfx: NONREACTIVE

## 2019-02-09 LAB — CERVICOVAGINAL ANCILLARY ONLY
Bacterial vaginitis: NEGATIVE
Chlamydia: POSITIVE — AB
Neisseria Gonorrhea: NEGATIVE
Trichomonas: POSITIVE — AB

## 2019-02-10 ENCOUNTER — Telehealth (HOSPITAL_COMMUNITY): Payer: Self-pay | Admitting: Emergency Medicine

## 2019-02-10 MED ORDER — AZITHROMYCIN 250 MG PO TABS: 1000.0000 mg | ORAL_TABLET | Freq: Once | ORAL | 0 refills | Status: AC

## 2019-02-10 MED ORDER — METRONIDAZOLE 500 MG PO TABS: 2000.0000 mg | ORAL_TABLET | Freq: Once | ORAL | 0 refills | Status: AC

## 2019-02-10 NOTE — Telephone Encounter (Signed)
Chlamydia is positive.  Rx po zithromax 1g #1 dose no refills was sent to the pharmacy of record.  Pt needs education to please refrain from sexual intercourse for 7 days to give the medicine time to work, sexual partners need to be notified and tested/treated.  Condoms may reduce risk of reinfection.  Recheck or followup with PCP for further evaluation if symptoms are not improving.   [facility] notified.  Trichomonas is positive. Rx  for Flagyl 2 grams, once was sent to the pharmacy of record. Pt needs education to refrain from sexual intercourse for 7 days to give the medicine time to work. Sexual partners need to be notified and tested/treated. Condoms may reduce risk of reinfection. Recheck for further evaluation if symptoms are not improving.   Spoke with pt verbalized understanding of results

## 2019-10-13 ENCOUNTER — Other Ambulatory Visit: Payer: Self-pay

## 2019-10-13 ENCOUNTER — Emergency Department (HOSPITAL_COMMUNITY)
Admission: EM | Admit: 2019-10-13 | Discharge: 2019-10-13 | Disposition: A | Discharge status: Home / Self Care | Payer: Medicaid Other | Attending: Emergency Medicine | Admitting: Emergency Medicine

## 2019-10-13 ENCOUNTER — Ambulatory Visit: Payer: Medicaid Other | Attending: Internal Medicine

## 2019-10-13 DIAGNOSIS — Z79899 Other long term (current) drug therapy: Secondary | ICD-10-CM | POA: Insufficient documentation

## 2019-10-13 DIAGNOSIS — Z20822 Contact with and (suspected) exposure to covid-19: Secondary | ICD-10-CM | POA: Insufficient documentation

## 2019-10-13 DIAGNOSIS — H1032 Unspecified acute conjunctivitis, left eye: Secondary | ICD-10-CM | POA: Insufficient documentation

## 2019-10-13 MED ORDER — ERYTHROMYCIN 5 MG/GM OP OINT: TOPICAL_OINTMENT | OPHTHALMIC | 0 refills | Status: DC

## 2019-10-13 MED ORDER — TETRACAINE HCL 0.5 % OP SOLN
2.0000 [drp] | Freq: Once | OPHTHALMIC | Status: AC
  Administered 2019-10-13: 11:00:00 2 [drp] via OPHTHALMIC
  Filled 2019-10-13: qty 4

## 2019-10-13 MED ORDER — FLUORESCEIN SODIUM 1 MG OP STRP
1.0000 | ORAL_STRIP | Freq: Once | OPHTHALMIC | Status: AC
  Administered 2019-10-13: 1 via OPHTHALMIC
  Filled 2019-10-13: qty 1

## 2019-10-13 NOTE — ED Triage Notes (Signed)
Patient presents to the ED with left eye redness with draining for 3 days.

## 2019-10-13 NOTE — ED Provider Notes (Signed)
[facility] EMERGENCY DEPARTMENT Provider Note   CSN: [id number] Arrival date & time: 10/13/19  0272     History Chief Complaint  Patient presents with  . Eye Drainage    Lauren Guzman is a 24 y.o. female.  The history is provided by the patient. No language interpreter was used.     24 year old female presenting for evaluation of irritation.  Patient report for the past 4 days she has had progressive worsening eye irritation.  It started in her left eye and now affecting her right eye.  She endorsed some itchiness mild irritation, in the morning her eyes got matted shut.  She also noticed that her eyes are red.  She tries over-the-counter medication such as Visine and red eye reducer without adequate relief.  She does not complain of any significant pain, double vision, loss of vision, runny nose sneezing or coughing.  She did recall exposing to someone else that has pinkeye approximate 2 weeks ago.  She does not wear any contact lenses and denies any recent environmental changes.  Does not complain of any foreign body sensation.  Past Medical History:  Diagnosis Date  . History of concussion 03/2017    Patient Active Problem List   Diagnosis Date Noted  . Chronic migraine 05/18/2017  . Nexplanon insertion 07/09/2016    No past surgical history on file.   OB History   No obstetric history on file.     Family History  Problem Relation Age of Onset  . Diabetes Father   . Hypertension Father   . Hypertension Mother   . Diabetes Maternal Grandfather   . Lung cancer Maternal Grandfather   . Prostate cancer Maternal Grandfather   . Colon cancer Maternal Grandfather   . Diabetes Paternal Grandmother   . Diabetes Paternal Grandfather     Social History   Tobacco Use  . Smoking status: Never Smoker  . Smokeless tobacco: Never Used  Substance Use Topics  . Alcohol use: No  . Drug use: No    Home Medications Prior to Admission medications   Medication Sig Start  Date End Date Taking? Authorizing Provider  etonogestrel (NEXPLANON) 68 MG IMPL implant 1 each by Subdermal route once.    [provider]  ibuprofen (ADVIL,MOTRIN) 600 MG tablet Take 1 tablet (600 mg total) by mouth every 6 (six) hours as needed. 11/12/17   Evalee Jefferson, PA-C  methocarbamol (ROBAXIN) 500 MG tablet Take 1 tablet (500 mg total) by mouth 2 (two) times daily. 03/22/17   Muthersbaugh, Jarrett Soho, PA-C  naproxen (NAPROSYN) 500 MG tablet Take 1 tablet (500 mg total) by mouth 2 (two) times daily with a meal. 03/22/17   Muthersbaugh, Jarrett Soho, PA-C  SUMAtriptan (IMITREX) 50 MG tablet Take 1 tablet (50 mg total) by mouth every 2 (two) hours as needed for migraine. May repeat in 2 hours if headache persists or recurs. 05/18/17   Marcial Pacas, MD    Allergies    Kiwi extract  Review of Systems   Review of Systems  Constitutional: Negative for fever.  HENT: Negative for congestion and ear discharge.   Eyes: Positive for discharge, redness and itching. Negative for photophobia, pain and visual disturbance.    Physical Exam Updated Vital Signs BP 127/83 (BP Location: Right Arm)   Pulse 91   Temp 98.1 F (36.7 C) (Oral)   Resp 18   Ht 5\' 2"  (1.575 m)   Wt 87.1 kg   SpO2 100%   BMI 35.12 kg/m  Physical Exam Vitals and nursing note reviewed.  Constitutional:      General: She is not in acute distress.    Appearance: She is well-developed.  HENT:     Head: Atraumatic.  Eyes:     General: Lids are normal. Lids are everted, no foreign bodies appreciated. No visual field deficit or scleral icterus.       Right eye: No foreign body, discharge or hordeolum.        Left eye: No foreign body, discharge or hordeolum.     Extraocular Movements: Extraocular movements intact.     Conjunctiva/sclera:     Right eye: Right conjunctiva is not injected. No chemosis, exudate or hemorrhage.    Left eye: Left conjunctiva is injected. No chemosis, exudate or hemorrhage.    Pupils: Pupils are  equal, round, and reactive to light.     Left eye: No corneal abrasion or fluorescein uptake. Seidel exam negative. Musculoskeletal:     Cervical back: Neck supple.  Skin:    Findings: No rash.  Neurological:     Mental Status: She is alert.     ED Results / Procedures / Treatments   Labs (all labs ordered are listed, but only abnormal results are displayed) Labs Reviewed - No data to display  EKG None  Radiology No results found.  Procedures Procedures (including critical care time)  Medications Ordered in ED Medications  tetracaine (PONTOCAINE) 0.5 % ophthalmic solution 2 drop (has no administration in time range)  fluorescein ophthalmic strip 1 strip (has no administration in time range)    ED Course  I have reviewed the triage vital signs and the nursing notes.  Pertinent labs & imaging results that were available during my care of the patient were reviewed by me and considered in my medical decision making (see chart for details).    MDM Rules/Calculators/A&P                      BP 127/83 (BP Location: Right Arm)   Pulse 91   Temp 98.1 F (36.7 C) (Oral)   Resp 18   Ht 5\' 2"  (1.575 m)   Wt 87.1 kg   SpO2 100%   BMI 35.12 kg/m   Final Clinical Impression(s) / ED Diagnoses Final diagnoses:  Acute conjunctivitis of left eye, unspecified acute conjunctivitis type    Rx / DC Orders ED Discharge Orders         Ordered    erythromycin ophthalmic ointment     10/13/19 1110         10:14 AM Patient complaining of bilateral eye irritation left greater than right for the past 4 days.  Symptoms suggestive of conjunctivitis.  No evidence of corneal abrasion on Woods lamp examination.  Visual acuity is 20/20 bilaterally.  Will prescribe erythromycin ointment as treatment for potential bacterial conjunctivitis.  Return precaution discussed.  Ophthalmology referral given as needed.   12/11/19, PA-C 10/13/19 1111    12/11/19, MD 10/13/19 [phone]

## 2019-10-14 LAB — NOVEL CORONAVIRUS, NAA: SARS-CoV-2, NAA: NOT DETECTED

## 2019-11-17 ENCOUNTER — Ambulatory Visit: Payer: Medicaid Other | Attending: Internal Medicine

## 2019-11-17 DIAGNOSIS — Z23 Encounter for immunization: Secondary | ICD-10-CM

## 2019-11-17 NOTE — Progress Notes (Signed)
   Covid-19 Vaccination Clinic  Name:  Lauren Guzman    MRN: [id number] DOB: 20-Jul-1996  11/17/2019  [name] was observed post Covid-19 immunization for 15 minutes without incident. She was provided with Vaccine Information Sheet and instruction to access the V-Safe system.   [name] was instructed to call 911 with any severe reactions post vaccine: Marland Kitchen Difficulty breathing  . Swelling of face and throat  . A fast heartbeat  . A bad rash all over body  . Dizziness and weakness   Immunizations Administered    Name Date Dose VIS Date Route   Moderna COVID-19 Vaccine 11/17/2019 10:14 AM 0.5 mL 08/09/2019 Intramuscular   Manufacturer: Moderna   Lot: [id number]   NDC: 16384-536-46

## 2019-12-20 ENCOUNTER — Ambulatory Visit: Payer: Medicaid Other | Attending: Internal Medicine

## 2019-12-20 DIAGNOSIS — Z23 Encounter for immunization: Secondary | ICD-10-CM

## 2019-12-20 NOTE — Progress Notes (Signed)
   Covid-19 Vaccination Clinic  Name:  ADER FRITZE    MRN: [id number] DOB: [birth year]  12/20/2019  Ms. Delawder was observed post Covid-19 immunization for 15 minutes without incident. She was provided with Vaccine Information Sheet and instruction to access the V-Safe system.   Ms. Wander was instructed to call 911 with any severe reactions post vaccine: Marland Kitchen Difficulty breathing  . Swelling of face and throat  . A fast heartbeat  . A bad rash all over body  . Dizziness and weakness   Immunizations Administered    Name Date Dose VIS Date Route   Moderna COVID-19 Vaccine 12/20/2019 10:37 AM 0.5 mL 08/09/2019 Intramuscular   Manufacturer: Moderna   Lot: [id number]   NDC: 62035-597-41

## 2022-06-19 ENCOUNTER — Other Ambulatory Visit: Payer: Self-pay | Admitting: Obstetrics & Gynecology

## 2022-06-19 DIAGNOSIS — O3680X0 Pregnancy with inconclusive fetal viability, not applicable or unspecified: Secondary | ICD-10-CM

## 2022-06-23 ENCOUNTER — Ambulatory Visit (INDEPENDENT_AMBULATORY_CARE_PROVIDER_SITE_OTHER): Payer: Medicaid Other

## 2022-06-23 DIAGNOSIS — O3680X0 Pregnancy with inconclusive fetal viability, not applicable or unspecified: Secondary | ICD-10-CM

## 2022-06-23 DIAGNOSIS — Z3A01 Less than 8 weeks gestation of pregnancy: Secondary | ICD-10-CM | POA: Diagnosis not present

## 2022-06-23 NOTE — Progress Notes (Signed)
[place] 7+4 wks,single IUP with yolk sac,normal ovaries,FHR 164 bpm,CRL 14.8 mm

## 2022-07-28 ENCOUNTER — Other Ambulatory Visit: Payer: Self-pay

## 2022-07-28 DIAGNOSIS — Z3682 Encounter for antenatal screening for nuchal translucency: Secondary | ICD-10-CM

## 2022-07-29 ENCOUNTER — Ambulatory Visit: Payer: Medicaid Other | Admitting: *Deleted

## 2022-07-29 ENCOUNTER — Ambulatory Visit (INDEPENDENT_AMBULATORY_CARE_PROVIDER_SITE_OTHER): Payer: Medicaid Other | Admitting: Women's Health

## 2022-07-29 ENCOUNTER — Encounter: Payer: Self-pay | Admitting: Women's Health

## 2022-07-29 ENCOUNTER — Ambulatory Visit (INDEPENDENT_AMBULATORY_CARE_PROVIDER_SITE_OTHER): Payer: Medicaid Other

## 2022-07-29 VITALS — BP 124/77 | HR 102 | Wt 199.0 lb

## 2022-07-29 DIAGNOSIS — Z833 Family history of diabetes mellitus: Secondary | ICD-10-CM

## 2022-07-29 DIAGNOSIS — Z131 Encounter for screening for diabetes mellitus: Secondary | ICD-10-CM

## 2022-07-29 DIAGNOSIS — Z3682 Encounter for antenatal screening for nuchal translucency: Secondary | ICD-10-CM

## 2022-07-29 DIAGNOSIS — Z1332 Encounter for screening for maternal depression: Secondary | ICD-10-CM

## 2022-07-29 DIAGNOSIS — O285 Abnormal chromosomal and genetic finding on antenatal screening of mother: Secondary | ICD-10-CM

## 2022-07-29 DIAGNOSIS — Z3402 Encounter for supervision of normal first pregnancy, second trimester: Secondary | ICD-10-CM | POA: Diagnosis not present

## 2022-07-29 DIAGNOSIS — Z3A12 12 weeks gestation of pregnancy: Secondary | ICD-10-CM

## 2022-07-29 DIAGNOSIS — Z3400 Encounter for supervision of normal first pregnancy, unspecified trimester: Secondary | ICD-10-CM | POA: Insufficient documentation

## 2022-07-29 DIAGNOSIS — Z3401 Encounter for supervision of normal first pregnancy, first trimester: Secondary | ICD-10-CM

## 2022-07-29 MED ORDER — BLOOD PRESSURE MONITOR MISC: 0 refills | Status: DC

## 2022-07-29 MED ORDER — ASPIRIN 81 MG PO TBEC: 81.0000 mg | DELAYED_RELEASE_TABLET | Freq: Every day | ORAL | 3 refills | Status: DC

## 2022-07-29 NOTE — Progress Notes (Signed)
[place] 12+5 wks, measurements c/w dates,CRL 66.99 mm,NT 1.5 mm,NB present,normal ovaries,anterior placenta,FHR 164 bpm,

## 2022-07-29 NOTE — Patient Instructions (Signed)
Lauren Guzman, thank you for choosing our office today! We appreciate the opportunity to meet your healthcare needs. You may receive a short survey by mail, e-mail, or through [employer]. If you are happy with your care we would appreciate if you could take just a few minutes to complete the survey questions. We read all of your comments and take your feedback very seriously. Thank you again for choosing our office.  Center for [employer] [facility] Team at [facility]  [facility] at [facility] ([address] [place], [place] [address]) Entrance C, located off of [address] Free 24/7 valet parking   Nausea & Vomiting Have saltine crackers or pretzels by your bed and eat a few bites before you raise your head out of bed in the morning Eat small frequent meals throughout the day instead of large meals Drink plenty of fluids throughout the day to stay hydrated, just don't drink a lot of fluids with your meals.  This can make your stomach fill up faster making you feel sick Do not brush your teeth right after you eat Products with real ginger are good for nausea, like ginger ale and ginger hard candy Make sure it says made with real ginger! Sucking on sour candy like lemon heads is also good for nausea If your prenatal vitamins make you nauseated, take them at night so you will sleep through the nausea Sea Bands If you feel like you need medicine for the nausea & vomiting please let us know If you are unable to keep any fluids or food down please let us know   Constipation Drink plenty of fluid, preferably water, throughout the day Eat foods high in fiber such as fruits, vegetables, and grains Exercise, such as walking, is a good way to keep your bowels regular Drink warm fluids, especially warm prune juice, or decaf coffee Eat a 1/2 cup of real oatmeal (not instant), 1/2 cup applesauce, and 1/2-1 cup warm prune juice every day If needed, you may take Colace (docusate sodium) stool softener  once or twice a day to help keep the stool soft.  If you still are having problems with constipation, you may take Miralax once daily as needed to help keep your bowels regular.   Home Blood Pressure Monitoring for Patients   Your provider has recommended that you check your blood pressure (BP) at least once a week at home. If you do not have a blood pressure cuff at home, one will be provided for you. Contact your provider if you have not received your monitor within 1 week.   Helpful Tips for Accurate Home Blood Pressure Checks  Don't smoke, exercise, or drink caffeine 30 minutes before checking your BP Use the restroom before checking your BP (a full bladder can raise your pressure) Relax in a comfortable upright chair Feet on the ground Left arm resting comfortably on a flat surface at the level of your heart Legs uncrossed Back supported Sit quietly and don't talk Place the cuff on your bare arm Adjust snuggly, so that only two fingertips can fit between your skin and the top of the cuff Check 2 readings separated by at least one minute Keep a log of your BP readings For a visual, please reference this diagram: http://ccnc.care/bpdiagram  Provider Name: [facility]     Phone: [phone]  Zone 1: ALL CLEAR  Continue to monitor your symptoms:  BP reading is less than 140 (top number) or less than 90 (bottom  number)  No right upper stomach pain No headaches or seeing spots No feeling nauseated or throwing up No swelling in face and hands  Zone 2: CAUTION Call your doctor's office for any of the following:  BP reading is greater than 140 (top number) or greater than 90 (bottom number)  Stomach pain under your ribs in the middle or right side Headaches or seeing spots Feeling nauseated or throwing up Swelling in face and hands  Zone 3: EMERGENCY  Seek immediate medical care if you have any of the following:  BP reading is greater than160 (top number) or greater than  110 (bottom number) Severe headaches not improving with Tylenol Serious difficulty catching your breath Any worsening symptoms from Zone 2    First Trimester of Pregnancy The first trimester of pregnancy is from week 1 until the end of week 12 (months 1 through 3). A week after a sperm fertilizes an egg, the egg will implant on the wall of the uterus. This embryo will begin to develop into a baby. Genes from you and your partner are forming the baby. The female genes determine whether the baby is a boy or a girl. At 6-8 weeks, the eyes and face are formed, and the heartbeat can be seen on ultrasound. At the end of 12 weeks, all the baby's organs are formed.  Now that you are pregnant, you will want to do everything you can to have a healthy baby. Two of the most important things are to get good prenatal care and to follow your health care provider's instructions. Prenatal care is all the medical care you receive before the baby's birth. This care will help prevent, find, and treat any problems during the pregnancy and childbirth. BODY CHANGES Your body goes through many changes during pregnancy. The changes vary from woman to woman.  You may gain or lose a couple of pounds at first. You may feel sick to your stomach (nauseous) and throw up (vomit). If the vomiting is uncontrollable, call your health care provider. You may tire easily. You may develop headaches that can be relieved by medicines approved by your health care provider. You may urinate more often. Painful urination may mean you have a bladder infection. You may develop heartburn as a result of your pregnancy. You may develop constipation because certain hormones are causing the muscles that push waste through your intestines to slow down. You may develop hemorrhoids or swollen, bulging veins (varicose veins). Your breasts may begin to grow larger and become tender. Your nipples may stick out more, and the tissue that surrounds them  (areola) may become darker. Your gums may bleed and may be sensitive to brushing and flossing. Dark spots or blotches (chloasma, mask of pregnancy) may develop on your face. This will likely fade after the baby is born. Your menstrual periods will stop. You may have a loss of appetite. You may develop cravings for certain kinds of food. You may have changes in your emotions from day to day, such as being excited to be pregnant or being concerned that something may go wrong with the pregnancy and baby. You may have more vivid and strange dreams. You may have changes in your hair. These can include thickening of your hair, rapid growth, and changes in texture. Some women also have hair loss during or after pregnancy, or hair that feels dry or thin. Your hair will most likely return to normal after your baby is born. WHAT TO EXPECT AT YOUR PRENATAL  VISITS During a routine prenatal visit: You will be weighed to make sure you and the baby are growing normally. Your blood pressure will be taken. Your abdomen will be measured to track your baby's growth. The fetal heartbeat will be listened to starting around week 10 or 12 of your pregnancy. Test results from any previous visits will be discussed. Your health care provider may ask you: How you are feeling. If you are feeling the baby move. If you have had any abnormal symptoms, such as leaking fluid, bleeding, severe headaches, or abdominal cramping. If you have any questions. Other tests that may be performed during your first trimester include: Blood tests to find your blood type and to check for the presence of any previous infections. They will also be used to check for low iron levels (anemia) and Rh antibodies. Later in the pregnancy, blood tests for diabetes will be done along with other tests if problems develop. Urine tests to check for infections, diabetes, or protein in the urine. An ultrasound to confirm the proper growth and development  of the baby. An amniocentesis to check for possible genetic problems. Fetal screens for spina bifida and Down syndrome. You may need other tests to make sure you and the baby are doing well. HOME CARE INSTRUCTIONS  Medicines Follow your health care provider's instructions regarding medicine use. Specific medicines may be either safe or unsafe to take during pregnancy. Take your prenatal vitamins as directed. If you develop constipation, try taking a stool softener if your health care provider approves. Diet Eat regular, well-balanced meals. Choose a variety of foods, such as meat or vegetable-based protein, fish, milk and low-fat dairy products, vegetables, fruits, and whole grain breads and cereals. Your health care provider will help you determine the amount of weight gain that is right for you. Avoid raw meat and uncooked cheese. These carry germs that can cause birth defects in the baby. Eating four or five small meals rather than three large meals a day may help relieve nausea and vomiting. If you start to feel nauseous, eating a few soda crackers can be helpful. Drinking liquids between meals instead of during meals also seems to help nausea and vomiting. If you develop constipation, eat more high-fiber foods, such as fresh vegetables or fruit and whole grains. Drink enough fluids to keep your urine clear or pale yellow. Activity and Exercise Exercise only as directed by your health care provider. Exercising will help you: Control your weight. Stay in shape. Be prepared for labor and delivery. Experiencing pain or cramping in the lower abdomen or low back is a good sign that you should stop exercising. Check with your health care provider before continuing normal exercises. Try to avoid standing for long periods of time. Move your legs often if you must stand in one place for a long time. Avoid heavy lifting. Wear low-heeled shoes, and practice good posture. You may continue to have sex  unless your health care provider directs you otherwise. Relief of Pain or Discomfort Wear a good support bra for breast tenderness.   Take warm sitz baths to soothe any pain or discomfort caused by hemorrhoids. Use hemorrhoid cream if your health care provider approves.   Rest with your legs elevated if you have leg cramps or low back pain. If you develop varicose veins in your legs, wear support hose. Elevate your feet for 15 minutes, 3-4 times a day. Limit salt in your diet. Prenatal Care Schedule your prenatal visits by the  twelfth week of pregnancy. They are usually scheduled monthly at first, then more often in the last 2 months before delivery. Write down your questions. Take them to your prenatal visits. Keep all your prenatal visits as directed by your health care provider. Safety Wear your seat belt at all times when driving. Make a list of emergency phone numbers, including numbers for family, friends, the hospital, and police and fire departments. General Tips Ask your health care provider for a referral to a local prenatal education class. Begin classes no later than at the beginning of month 6 of your pregnancy. Ask for help if you have counseling or nutritional needs during pregnancy. Your health care provider can offer advice or refer you to specialists for help with various needs. Do not use hot tubs, steam rooms, or saunas. Do not douche or use tampons or scented sanitary pads. Do not cross your legs for long periods of time. Avoid cat litter boxes and soil used by cats. These carry germs that can cause birth defects in the baby and possibly loss of the fetus by miscarriage or stillbirth. Avoid all smoking, herbs, alcohol, and medicines not prescribed by your health care provider. Chemicals in these affect the formation and growth of the baby. Schedule a dentist appointment. At home, brush your teeth with a soft toothbrush and be gentle when you floss. SEEK MEDICAL CARE IF:   You have dizziness. You have mild pelvic cramps, pelvic pressure, or nagging pain in the abdominal area. You have persistent nausea, vomiting, or diarrhea. You have a bad smelling vaginal discharge. You have pain with urination. You notice increased swelling in your face, hands, legs, or ankles. SEEK IMMEDIATE MEDICAL CARE IF:  You have a fever. You are leaking fluid from your vagina. You have spotting or bleeding from your vagina. You have severe abdominal cramping or pain. You have rapid weight gain or loss. You vomit blood or material that looks like coffee grounds. You are exposed to [place] measles and have never had them. You are exposed to fifth disease or chickenpox. You develop a severe headache. You have shortness of breath. You have any kind of trauma, such as from a fall or a car accident. Document Released: 08/19/2001 Document Revised: 01/09/2014 Document Reviewed: 07/05/2013 [facility] Patient Information 2015 [place], [place]. This information is not intended to replace advice given to you by your health care provider. Make sure you discuss any questions you have with your health care provider.

## 2022-07-29 NOTE — Progress Notes (Signed)
INITIAL OBSTETRICAL VISIT Patient name: Lauren Guzman MRN [id number]  Date of birth: 11-Jan-1996 Chief Complaint:   Initial Prenatal Visit  History of Present Illness:   Lauren Guzman is a 26 y.o. G1P0 African-American female at [redacted]w[redacted]d by LMP c/w u/s at 7 weeks with an Estimated Date of Delivery: 02/05/23 being seen today for her initial obstetrical visit.   Patient's last menstrual period was 05/01/2022. Her obstetrical history is significant for primigravida.   Today she reports  n/v improving, declines meds .  Last pap 2023. Results were: negative per pt report at clinic in [place]     07/29/2022    2:43 PM  Depression screen PHQ 2/9  Decreased Interest 0  Down, Depressed, Hopeless 0  PHQ - 2 Score 0  Altered sleeping 0  Tired, decreased energy 0  Change in appetite 0  Feeling bad or failure about yourself  0  Trouble concentrating 0  Moving slowly or fidgety/restless 0  Suicidal thoughts 0  PHQ-9 Score 0        07/29/2022    2:43 PM  GAD 7 : Generalized Anxiety Score  Nervous, Anxious, on Edge 0  Control/stop worrying 0  Worry too much - different things 0  Trouble relaxing 0  Restless 0  Easily annoyed or irritable 1  Afraid - awful might happen 0  Total GAD 7 Score 1     Review of Systems:   Pertinent items are noted in HPI Denies cramping/contractions, leakage of fluid, vaginal bleeding, abnormal vaginal discharge w/ itching/odor/irritation, headaches, visual changes, shortness of breath, chest pain, abdominal pain, severe nausea/vomiting, or problems with urination or bowel movements unless otherwise stated above.  Pertinent History Reviewed:  Reviewed past medical,surgical, social, obstetrical and family history.  Reviewed problem list, medications and allergies. OB History  Gravida Para Term Preterm AB Living  1            SAB IAB Ectopic Multiple Live Births               # Outcome Date GA Lbr Len/2nd Weight Sex Delivery Anes PTL Lv  1 Current             Physical Assessment:   Vitals:   07/29/22 1410  BP: 124/77  Pulse: (!) 102  Weight: 199 lb (90.3 kg)  Body mass index is 36.4 kg/m.       Physical Examination:  General appearance - well appearing, and in no distress  Mental status - alert, oriented to person, place, and time  Psych:  She has a normal mood and affect  Skin - warm and dry, normal color, no suspicious lesions noted  Chest - effort normal, all lung fields clear to auscultation bilaterally  Heart - normal rate and regular rhythm  Abdomen - soft, nontender  Extremities:  No swelling or varicosities noted  Thin prep pap is not done   Chaperone: N/A    TODAY'S NT [place] 12+5 wks, measurements c/w dates,CRL 66.99 mm,NT 1.5 mm,NB present,normal ovaries,anterior placenta,FHR 164 bpm,   No results found for this or any previous visit (from the past 24 hour(s)).  Assessment & Plan:  1) Low-Risk Pregnancy G1P0 at [redacted]w[redacted]d with an Estimated Date of Delivery: 02/05/23   2) Initial OB visit  3) N/V, improving, declines meds  Meds:  Meds ordered this encounter  Medications   Blood Pressure Monitor MISC    Sig: For regular home bp monitoring during pregnancy    Dispense:  1 each    Refill:  0    Z34.81 Please mail to patient   aspirin EC 81 MG tablet    Sig: Take 1 tablet (81 mg total) by mouth daily. Swallow whole.    Dispense:  90 tablet    Refill:  3    Initial labs obtained Continue prenatal vitamins Reviewed n/v relief measures and warning s/s to report Reviewed recommended weight gain based on pre-gravid BMI Encouraged well-balanced diet Genetic & carrier screening discussed: requests Panorama, NT/IT, and Horizon  Ultrasound discussed; fetal survey: requested CCNC completed> form faxed if has or is planning to apply for medicaid The nature of [employer] for [employer] with multiple MDs and other Advanced Practice Providers was explained to patient; also emphasized that fellows, residents,  and students are part of our team. Does not have home bp cuff. Office bp cuff given: no. Rx sent: yes. Check bp weekly, let us know if consistently >140/90.   Indications for ASA therapy (per uptodate) OR Two or more of the following: Nulliparity Yes Obesity (BMI>30 kg/m2) Yes Sociodemographic characteristics (African American race, low socioeconomic level) Yes  Indications for early A1C (per uptodate) BMI >=25 (>=23 in Asian women) AND one of the following High-risk race/ethnicity (eg, African American, Latino, Native American, [place] American, [place]) Yes  Follow-up: Return in about 4 weeks (around 08/26/2022) for LROB, 2nd IT, CNM, in person; then 7wks from now for anatomy u/s and LROB w/ CNM. Get pap records from [place] This Encounter  Procedures   GC/Chlamydia Probe Amp   Urine Culture   Integrated 1   CBC/D/Plt+RPR+Rh+ABO+RubIgG...   HORIZON CUSTOM   PANORAMA PRENATAL TEST FULL PANEL   Hemoglobin A1c    Roma Schanz CNM, [facility] 07/29/2022 3:53 PM

## 2022-07-31 LAB — CBC/D/PLT+RPR+RH+ABO+RUBIGG...
Antibody Screen: NEGATIVE
Basophils Absolute: 0 10*3/uL (ref 0.0–0.2)
Basos: 0 %
EOS (ABSOLUTE): 0.1 10*3/uL (ref 0.0–0.4)
Eos: 1 %
HCV Ab: NONREACTIVE
HIV Screen 4th Generation wRfx: NONREACTIVE
Hematocrit: 39.4 % (ref 34.0–46.6)
Hemoglobin: 13.1 g/dL (ref 11.1–15.9)
Hepatitis B Surface Ag: NEGATIVE
Immature Grans (Abs): 0 10*3/uL (ref 0.0–0.1)
Immature Granulocytes: 0 %
Lymphocytes Absolute: 1.8 10*3/uL (ref 0.7–3.1)
Lymphs: 23 %
MCH: 28.9 pg (ref 26.6–33.0)
MCHC: 33.2 g/dL (ref 31.5–35.7)
MCV: 87 fL (ref 79–97)
Monocytes Absolute: 0.4 10*3/uL (ref 0.1–0.9)
Monocytes: 5 %
Neutrophils Absolute: 5.3 10*3/uL (ref 1.4–7.0)
Neutrophils: 71 %
Platelets: 271 10*3/uL (ref 150–450)
RBC: 4.54 x10E6/uL (ref 3.77–5.28)
RDW: 12 % (ref 11.7–15.4)
RPR Ser Ql: NONREACTIVE
Rh Factor: POSITIVE
Rubella Antibodies, IGG: 4.76 index (ref >0.99)
WBC: 7.6 10*3/uL (ref 3.4–10.8)

## 2022-07-31 LAB — INTEGRATED 1
Crown Rump Length: 67 mm
Gest. Age on Collection Date: 12.9 weeks
Maternal Age at EDD: 26.7 yr
Nuchal Translucency (NT): 1.5 mm
Number of Fetuses: 1
PAPP-A Value: 665.3 ng/mL
Weight: 199 [lb_av]

## 2022-07-31 LAB — HEMOGLOBIN A1C
Est. average glucose Bld gHb Est-mCnc: 108 mg/dL
Hgb A1c MFr Bld: 5.4 % (ref 4.8–5.6)

## 2022-07-31 LAB — HCV INTERPRETATION

## 2022-08-01 LAB — GC/CHLAMYDIA PROBE AMP
Chlamydia trachomatis, NAA: NEGATIVE
Neisseria Gonorrhoeae by PCR: NEGATIVE

## 2022-08-01 LAB — URINE CULTURE

## 2022-08-11 LAB — HORIZON CUSTOM: REPORT SUMMARY: NEGATIVE

## 2022-08-11 LAB — PANORAMA PRENATAL TEST FULL PANEL:PANORAMA TEST PLUS 5 ADDITIONAL MICRODELETIONS: FETAL FRACTION: 6.6

## 2022-08-12 ENCOUNTER — Encounter: Payer: Self-pay | Admitting: Women's Health

## 2022-08-12 DIAGNOSIS — O285 Abnormal chromosomal and genetic finding on antenatal screening of mother: Secondary | ICD-10-CM | POA: Insufficient documentation

## 2022-08-12 NOTE — Addendum Note (Signed)
Addended by: Shawna Clamp R on: 08/12/2022 11:22 AM   Modules accepted: Orders

## 2022-08-18 ENCOUNTER — Encounter: Payer: Self-pay | Admitting: Women's Health

## 2022-08-26 ENCOUNTER — Ambulatory Visit (INDEPENDENT_AMBULATORY_CARE_PROVIDER_SITE_OTHER): Payer: Medicaid Other | Admitting: Women's Health

## 2022-08-26 ENCOUNTER — Encounter: Payer: Self-pay | Admitting: Women's Health

## 2022-08-26 VITALS — BP 121/76 | HR 113 | Wt 199.4 lb

## 2022-08-26 DIAGNOSIS — Z3402 Encounter for supervision of normal first pregnancy, second trimester: Secondary | ICD-10-CM

## 2022-08-26 DIAGNOSIS — Z363 Encounter for antenatal screening for malformations: Secondary | ICD-10-CM

## 2022-08-26 DIAGNOSIS — Z3A16 16 weeks gestation of pregnancy: Secondary | ICD-10-CM

## 2022-08-26 NOTE — Progress Notes (Signed)
LOW-RISK PREGNANCY VISIT Patient name: Lauren Guzman MRN [id number]  Date of birth: 1995-10-09 Chief Complaint:   Routine Prenatal Visit  History of Present Illness:   Lauren Guzman is a 26 y.o. G1P0 female at [redacted]w[redacted]d with an Estimated Date of Delivery: 02/05/23 being seen today for ongoing management of a low-risk pregnancy.   Today she reports no complaints. Contractions: Not present. Vag. Bleeding: None.  Movement: Present. denies leaking of fluid.     07/29/2022    2:43 PM  Depression screen PHQ 2/9  Decreased Interest 0  Down, Depressed, Hopeless 0  PHQ - 2 Score 0  Altered sleeping 0  Tired, decreased energy 0  Change in appetite 0  Feeling bad or failure about yourself  0  Trouble concentrating 0  Moving slowly or fidgety/restless 0  Suicidal thoughts 0  PHQ-9 Score 0        07/29/2022    2:43 PM  GAD 7 : Generalized Anxiety Score  Nervous, Anxious, on Edge 0  Control/stop worrying 0  Worry too much - different things 0  Trouble relaxing 0  Restless 0  Easily annoyed or irritable 1  Afraid - awful might happen 0  Total GAD 7 Score 1      Review of Systems:   Pertinent items are noted in HPI Denies abnormal vaginal discharge w/ itching/odor/irritation, headaches, visual changes, shortness of breath, chest pain, abdominal pain, severe nausea/vomiting, or problems with urination or bowel movements unless otherwise stated above. Pertinent History Reviewed:  Reviewed past medical,surgical, social, obstetrical and family history.  Reviewed problem list, medications and allergies. Physical Assessment:   Vitals:   08/26/22 1458  BP: 121/76  Pulse: (!) 113  Weight: 199 lb 6 oz (90.4 kg)  Body mass index is 36.47 kg/m.        Physical Examination:   General appearance: Well appearing, and in no distress  Mental status: Alert, oriented to person, place, and time  Skin: Warm & dry  Cardiovascular: Normal heart rate noted  Respiratory: Normal respiratory  effort, no distress  Abdomen: Soft, gravid, nontender  Pelvic: Cervical exam deferred         Extremities: Edema: None  Fetal Status: Fetal Heart Rate (bpm): 154   Movement: Present    Chaperone: N/A   No results found for this or any previous visit (from the past 24 hour(s)).  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  Labs/procedures today: 2nd IT  Plan:  Continue routine obstetrical care  Next visit: prefers will be in person for u/s     Reviewed: Preterm labor symptoms and general obstetric precautions including but not limited to vaginal bleeding, contractions, leaking of fluid and fetal movement were reviewed in detail with the patient.  All questions were answered. Does have home bp cuff. Office bp cuff given: not applicable. Check bp weekly, let us know if consistently >140 and/or >90.  Follow-up: Return for As scheduled.  Future Appointments  Date Time Provider Department Center  08/28/2022  2:30 PM [facility] GENETIC COUNSELING RM [facility]  09/16/2022  3:00 PM [facility] [place] [facility] None  09/16/2022  3:50 PM Kingsten Enfield, Merlene Laughter, CNM [facility]    Orders Placed This Encounter  Procedures   US  OB Comp + 14 Wk   INTEGRATED 2   Cheral Marker CNM, [facility] 08/26/2022 3:31 PM

## 2022-08-26 NOTE — Patient Instructions (Signed)
Lauren Guzman, thank you for choosing our office today! We appreciate the opportunity to meet your healthcare needs. You may receive a short survey by mail, e-mail, or through MyChart. If you are happy with your care we would appreciate if you could take just a few minutes to complete the survey questions. We read all of your comments and take your feedback very seriously. Thank you again for choosing our office.  [facility] Team at [facility] ([address] [place], [place] [address]) Entrance C, located off of [address] Free 24/7 valet parking  Go to [email or web address] to register for FREE online childbirth classes  Call the office ([phone]) or go to [facility] if: You begin to severe cramping Your water breaks.  Sometimes it is a big gush of fluid, sometimes it is just a trickle that keeps getting your panties wet or running down your legs You have vaginal bleeding.  It is normal to have a small amount of spotting if your cervix was checked.   [place] Pediatricians/Family Doctors [facility] ([facility]): [address], [phone]           [facility]: [address], [phone]                [facility] ([facility]): [address], [phone] (call to ask if accepting patients) [facility]: [address], [place], [phone]    [place] Pediatricians/Family Doctors [facility] ([facility]): [address], [phone] [facility]: [address], [phone] [facility] [place]: [address], [phone]  [place] [facility]  [facility] ([facility]): [phone] [facility]: [address], [phone]   [place] Family Doctors [facility]: [address], [phone]  [place] [facility]  [facility]: [address], [phone]  Home Blood Pressure Monitoring for Patients   Your provider has recommended that you check your blood pressure (BP) at least once a week at home. If you do not have a blood pressure cuff at home, one will be provided for you. Contact your provider if you have not received your monitor within 1 week.   Helpful Tips for Accurate Home Blood Pressure Checks  Don't smoke, exercise, or drink caffeine 30 minutes before checking your BP Use the restroom before checking your BP (a full bladder can raise your pressure) Relax in a comfortable upright chair Feet on the ground Left arm resting comfortably on a flat surface at the level of your heart Legs uncrossed Back supported Sit quietly and don't talk Place the cuff on your bare arm Adjust snuggly, so that only two fingertips can fit between your skin and the top of the cuff Check 2 readings separated by at least one minute Keep a log of your BP readings For a visual, please reference this diagram: http://ccnc.care/bpdiagram  Provider Name: [facility]     Phone: [phone]  Zone 1: ALL CLEAR  Continue to monitor your symptoms:  BP reading is less than 140 (top number) or less than 90 (bottom number)  No right upper stomach pain No headaches or seeing spots No feeling nauseated or throwing up No swelling in face and hands  Zone 2: CAUTION Call your doctor's office for any of the following:  BP reading is greater than 140 (top number) or greater than   90 (bottom number)  Stomach pain under your ribs in the middle or right side Headaches or seeing spots Feeling nauseated or throwing up Swelling in face and hands  Zone 3: EMERGENCY  Seek immediate medical care if you have any of the following:  BP reading is greater than160 (top number) or greater than 110 (bottom number) Severe headaches not improving with Tylenol Serious difficulty catching your breath Any worsening symptoms from  Zone 2     Second Trimester of Pregnancy The second trimester is from week 14 through week 27 (months 4 through 6). The second trimester is often a time when you feel your best. Your body has adjusted to being pregnant, and you begin to feel better physically. Usually, morning sickness has lessened or quit completely, you may have more energy, and you may have an increase in appetite. The second trimester is also a time when the fetus is growing rapidly. At the end of the sixth month, the fetus is about 9 inches long and weighs about 1 pounds. You will likely begin to feel the baby move (quickening) between 16 and 20 weeks of pregnancy. Body changes during your second trimester Your body continues to go through many changes during your second trimester. The changes vary from woman to woman. Your weight will continue to increase. You will notice your lower abdomen bulging out. You may begin to get stretch marks on your hips, abdomen, and breasts. You may develop headaches that can be relieved by medicines. The medicines should be approved by your health care provider. You may urinate more often because the fetus is pressing on your bladder. You may develop or continue to have heartburn as a result of your pregnancy. You may develop constipation because certain hormones are causing the muscles that push waste through your intestines to slow down. You may develop hemorrhoids or swollen, bulging veins (varicose veins). You may have back pain. This is caused by: Weight gain. Pregnancy hormones that are relaxing the joints in your pelvis. A shift in weight and the muscles that support your balance. Your breasts will continue to grow and they will continue to become tender. Your gums may bleed and may be sensitive to brushing and flossing. Dark spots or blotches (chloasma, mask of pregnancy) may develop on your face. This will likely fade after the baby is born. A dark line from your belly button to  the pubic area (linea nigra) may appear. This will likely fade after the baby is born. You may have changes in your hair. These can include thickening of your hair, rapid growth, and changes in texture. Some women also have hair loss during or after pregnancy, or hair that feels dry or thin. Your hair will most likely return to normal after your baby is born.  What to expect at prenatal visits During a routine prenatal visit: You will be weighed to make sure you and the fetus are growing normally. Your blood pressure will be taken. Your abdomen will be measured to track your baby's growth. The fetal heartbeat will be listened to. Any test results from the previous visit will be discussed.  Your health care provider may ask you: How you are feeling. If you are feeling the baby move. If you have had any abnormal symptoms, such as leaking fluid, bleeding, severe headaches, or abdominal cramping. If you are using any tobacco products, including cigarettes, chewing tobacco, and electronic cigarettes. If you have any questions.  Other tests that may be performed during   your second trimester include: Blood tests that check for: Low iron levels (anemia). High blood sugar that affects pregnant women (gestational diabetes) between 24 and 28 weeks. Rh antibodies. This is to check for a protein on red blood cells (Rh factor). Urine tests to check for infections, diabetes, or protein in the urine. An ultrasound to confirm the proper growth and development of the baby. An amniocentesis to check for possible genetic problems. Fetal screens for spina bifida and Down syndrome. HIV (human immunodeficiency virus) testing. Routine prenatal testing includes screening for HIV, unless you choose not to have this test.  Follow these instructions at home: Medicines Follow your health care provider's instructions regarding medicine use. Specific medicines may be either safe or unsafe to take during  pregnancy. Take a prenatal vitamin that contains at least 600 micrograms (mcg) of folic acid. If you develop constipation, try taking a stool softener if your health care provider approves. Eating and drinking Eat a balanced diet that includes fresh fruits and vegetables, whole grains, good sources of protein such as meat, eggs, or tofu, and low-fat dairy. Your health care provider will help you determine the amount of weight gain that is right for you. Avoid raw meat and uncooked cheese. These carry germs that can cause birth defects in the baby. If you have low calcium intake from food, talk to your health care provider about whether you should take a daily calcium supplement. Limit foods that are high in fat and processed sugars, such as fried and sweet foods. To prevent constipation: Drink enough fluid to keep your urine clear or pale yellow. Eat foods that are high in fiber, such as fresh fruits and vegetables, whole grains, and beans. Activity Exercise only as directed by your health care provider. Most women can continue their usual exercise routine during pregnancy. Try to exercise for 30 minutes at least 5 days a week. Stop exercising if you experience uterine contractions. Avoid heavy lifting, wear low heel shoes, and practice good posture. A sexual relationship may be continued unless your health care provider directs you otherwise. Relieving pain and discomfort Wear a good support bra to prevent discomfort from breast tenderness. Take warm sitz baths to soothe any pain or discomfort caused by hemorrhoids. Use hemorrhoid cream if your health care provider approves. Rest with your legs elevated if you have leg cramps or low back pain. If you develop varicose veins, wear support hose. Elevate your feet for 15 minutes, 3-4 times a day. Limit salt in your diet. Prenatal Care Write down your questions. Take them to your prenatal visits. Keep all your prenatal visits as told by your health  care provider. This is important. Safety Wear your seat belt at all times when driving. Make a list of emergency phone numbers, including numbers for family, friends, the hospital, and police and fire departments. General instructions Ask your health care provider for a referral to a local prenatal education class. Begin classes no later than the beginning of month 6 of your pregnancy. Ask for help if you have counseling or nutritional needs during pregnancy. Your health care provider can offer advice or refer you to specialists for help with various needs. Do not use hot tubs, steam rooms, or saunas. Do not douche or use tampons or scented sanitary pads. Do not cross your legs for long periods of time. Avoid cat litter boxes and soil used by cats. These carry germs that can cause birth defects in the baby and possibly loss of the   fetus by miscarriage or stillbirth. Avoid all smoking, herbs, alcohol, and unprescribed drugs. Chemicals in these products can affect the formation and growth of the baby. Do not use any products that contain nicotine or tobacco, such as cigarettes and e-cigarettes. If you need help quitting, ask your health care provider. Visit your dentist if you have not gone yet during your pregnancy. Use a soft toothbrush to brush your teeth and be gentle when you floss. Contact a health care provider if: You have dizziness. You have mild pelvic cramps, pelvic pressure, or nagging pain in the abdominal area. You have persistent nausea, vomiting, or diarrhea. You have a bad smelling vaginal discharge. You have pain when you urinate. Get help right away if: You have a fever. You are leaking fluid from your vagina. You have spotting or bleeding from your vagina. You have severe abdominal cramping or pain. You have rapid weight gain or weight loss. You have shortness of breath with chest pain. You notice sudden or extreme swelling of your face, hands, ankles, feet, or legs. You  have not felt your baby move in over an hour. You have severe headaches that do not go away when you take medicine. You have vision changes. Summary The second trimester is from week 14 through week 27 (months 4 through 6). It is also a time when the fetus is growing rapidly. Your body goes through many changes during pregnancy. The changes vary from woman to woman. Avoid all smoking, herbs, alcohol, and unprescribed drugs. These chemicals affect the formation and growth your baby. Do not use any tobacco products, such as cigarettes, chewing tobacco, and e-cigarettes. If you need help quitting, ask your health care provider. Contact your health care provider if you have any questions. Keep all prenatal visits as told by your health care provider. This is important. This information is not intended to replace advice given to you by your health care provider. Make sure you discuss any questions you have with your health care provider. Document Released: 08/19/2001 Document Revised: 01/31/2016 Document Reviewed: 10/26/2012 Elsevier Interactive Patient Education  2017 Elsevier Inc.  

## 2022-08-28 ENCOUNTER — Ambulatory Visit: Payer: Medicaid Other | Attending: Women's Health

## 2022-08-28 LAB — INTEGRATED 2
AFP MoM: 0.91
Alpha-Fetoprotein: 29 ng/mL
Crown Rump Length: 67 mm
DIA MoM: 1.32
DIA Value: 168.2 pg/mL
Estriol, Unconjugated: 0.97 ng/mL
Gest. Age on Collection Date: 12.9 weeks
Gestational Age: 16.9 weeks
Maternal Age at EDD: 26.7 yr
Nuchal Translucency (NT): 1.5 mm
Nuchal Translucency MoM: 0.99
Number of Fetuses: 1
PAPP-A MoM: 0.8
PAPP-A Value: 665.3 ng/mL
Test Results:: NEGATIVE
Weight: 199 [lb_av]
Weight: 199 [lb_av]
hCG MoM: 1.3
hCG Value: 33.8 IU/mL
uE3 MoM: 0.93

## 2022-09-08 NOTE — L&D Delivery Note (Signed)
OB/GYN Faculty Practice Delivery Note  Lauren Guzman is a 27 y.o. G1P1001 s/p vag del at 104w5d. She was admitted for in latent labor with new dx of gHTN.   ROM: 2h 46m with clear fluid GBS Status: pos Maximum Maternal Temperature: 98.6  Labor Progress: Ms Gong as admitted on the morning of 5/28 in latent labor; she had mild range BP elevations giving a dx of gHTN as well. She had Pitocin and AROM to augment her labor. PCN x 2 doses given prior to delivery.  Delivery Date/Time: May 28th, 2024 at 1627 Delivery: Called to room and patient was complete and pushing. Head delivered ROA. No nuchal cord present. Shoulder and body delivered in usual fashion. Infant with spontaneous cry, placed on mother's abdomen, dried and stimulated. Cord clamped x 2 after 1-minute delay, and cut by FOB. Cord blood drawn. Placenta delivered spontaneously with gentle cord traction. Fundus firm with massage and Pitocin. Labia, perineum, vagina, and cervix inspected and found to be intact.   Placenta: spont, intact; to L&D Complications: none Lacerations: vag lac repaired w a figure of 8 suture with Monocryl EBL: 413 Analgesia: epidural  Postpartum Planning [x]  message to sent to schedule follow-up  [x]  Lasix 20mg  x 5d  meds to beds ordered  pp BabyRx ordered  Infant: boy  APGARs 9/9  2870g (6lb 5.2oz)  Arabella Merles, CNM  02/03/2023 5:19 PM

## 2022-09-10 ENCOUNTER — Encounter: Payer: Self-pay | Admitting: Genetics

## 2022-09-10 ENCOUNTER — Ambulatory Visit: Payer: Medicaid Other | Attending: Women's Health

## 2022-09-10 DIAGNOSIS — Z3A18 18 weeks gestation of pregnancy: Secondary | ICD-10-CM

## 2022-09-10 DIAGNOSIS — O285 Abnormal chromosomal and genetic finding on antenatal screening of mother: Secondary | ICD-10-CM

## 2022-09-10 NOTE — Progress Notes (Signed)
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   EDD: 02/05/2023 LMP: 05/01/2022 Referring Provider:  Roma Schanz, CNM  EGA: [redacted]w[redacted]d Genetic Counselor: Staci Righter, MS, [place]  OB Hx: G1P0 Date of Appointment: 09/10/2022  Accompanied by: [place] Time: 30 Minutes   Previous Testing Completed: Courney previously completed carrier screening. She screened to not be a carrier for Cystic Fibrosis (CF), Spinal Muscular Atrophy (SMA), alpha thalassemia, and beta hemoglobinopathies. A negative result on carrier screening reduces the likelihood of being a carrier, however, does not entirely rule out the possibility. Tymesha previously completed Integrated screening in this pregnancy. This is a screen that is performed in two parts between 10-14 weeks and 15-[redacted] weeks gestation. It utilizes the Nuchal Translucency (NT) measurement as well as measurements of PAPP-A, AFP HCG, DIA, and uE3 in the maternal serum. Emon's Integrated screen quotes a 1 in 10,000 risk for the current pregnancy to have Trisomy 21 (Down syndrome), Trisomy 18, and Open Neural Tube Defects. The Integrated screen does not assess risk for Trisomy 71 and sex chromosome conditions.  [place]ington previously completed cell-free DNA screening (cfDNA) in this pregnancy. The result is low risk for Trisomy 102 (Down syndrome, Trisomy 4, Trisomy 79, Triploidy, and the 22q11.2 microdeletion syndrome. The result is high risk for an atypical finding involving the X chromosome (see genetic counseling portion of the note below).    Medical & Family History:  Reports she takes prenatal vitamins. Denies personal history of diabetes, high blood pressure, thyroid conditions, and seizures. Denies bleeding, infections, and fevers in this  pregnancy. Denies using tobacco, alcohol, or street drugs in this pregnancy.  Faizah reports her paternal aunt's daughter's son is affected with sickle cell disease. We shared with Rossanna that she screened to not be a carrier for sickle cell disease and other Beta-Hemoglobinopathies on her carrier screening.  Maternal ethnicity reported as African American and paternal ethnicity reported as African American. Denies consanguinity. Amour denied a family history of chromosome conditions, intellectual disability, autism, birth defects, bone/skeletal disorders, blindness, deafness, neuromuscular disorders, still births, early infant deaths, and 3 or more pregnancy losses for one person on her prenatal intake questionnaire.     Genetic Counseling:   Atypical Cell-Free DNA Screening Result. Willa previously completed cell-free DNA screening (cfDNA) in this pregnancy. We reviewed that this screen analyzes cell-free DNA originating from the placenta that is found in the maternal blood circulation during pregnancy. This test can provide information regarding the presence or absence of extra fetal DNA for chromosomes 13, 18, and 21 as well as the sex chromosomes. The result is low risk for Down syndrome, Trisomy 70, Trisomy 3, Triploidy, and the 22q11.2 microdeletion syndrome. For Shandell's cfDNA result, the laboratory reports a suspected finding outside the scope of the test. The origin of the atypical finding, which involves the X chromosome, could not be specified and may include, but is not limited to, fetal and/or maternal mosaicism, fetal and/or maternal chromosome abnormality, or normal variation. Fetal risk assessment for monosomy X and fetal sex could not be performed. Genetic counseling reviewed this result with Eddis in detail. The common features of some sex chromosome conditions was also discussed. Arlene was counseled that she has the option of continuing to monitor the pregnancy via routine ultrasounds.  However, pregnancies  with X chromosome problems may not demonstrate features of the condition on prenatal ultrasound. Thus, a normal-appearing ultrasound would not rule out the possibility of the baby being affected. If Abigale opted to monitor with only ultrasound examination, we discussed that her baby should have an evaluation by pediatric genetics after birth to discuss postnatal genetic testing. We also discussed the option of amniocentesis for prenatal diagnosis. Possible procedural difficulties and complications that can arise include maternal infection, cramping, bleeding, fluid leakage, and/or pregnancy loss. The risk for pregnancy loss with an amniocentesis is 1/500. Genetic testing that could be ordered on an amniocentesis sample includes a fetal karyotype, fetal microarray, and testing for specific syndromes.  Ritha was also offered maternal blood chromosome analysis. Bruchy declined amniocentesis for prenatal diagnosis and also declined the maternal blood chromosome analysis at this time.    Patient Plan:  Proceed with: Routine prenatal care Informed consent was obtained. All questions were answered.  Declined: Amniocentesis & maternal blood chromosome analysis   Thank you for sharing in the care of [place] with [place].  Please do not hesitate to contact us if you have any questions.  Staci Righter, MS, [facility]

## 2022-09-16 ENCOUNTER — Ambulatory Visit (INDEPENDENT_AMBULATORY_CARE_PROVIDER_SITE_OTHER): Payer: Medicaid Other | Admitting: Women's Health

## 2022-09-16 ENCOUNTER — Encounter: Payer: Self-pay | Admitting: Women's Health

## 2022-09-16 ENCOUNTER — Ambulatory Visit (INDEPENDENT_AMBULATORY_CARE_PROVIDER_SITE_OTHER): Payer: Medicaid Other

## 2022-09-16 VITALS — BP 115/77 | HR 99 | Wt 204.0 lb

## 2022-09-16 DIAGNOSIS — O285 Abnormal chromosomal and genetic finding on antenatal screening of mother: Secondary | ICD-10-CM

## 2022-09-16 DIAGNOSIS — Z3402 Encounter for supervision of normal first pregnancy, second trimester: Secondary | ICD-10-CM

## 2022-09-16 DIAGNOSIS — Z363 Encounter for antenatal screening for malformations: Secondary | ICD-10-CM | POA: Diagnosis not present

## 2022-09-16 DIAGNOSIS — Z3482 Encounter for supervision of other normal pregnancy, second trimester: Secondary | ICD-10-CM

## 2022-09-16 DIAGNOSIS — Z3A19 19 weeks gestation of pregnancy: Secondary | ICD-10-CM

## 2022-09-16 NOTE — Progress Notes (Signed)
[place] 67+1 wks,cephalic,cx 3 cm,anterior placenta gr 0,normal ovaries,SVP of fluid 4.8 cm, LVEICF X 2,FHR 152 bpm,EFW 339 g 72%,anatomy complete

## 2022-09-16 NOTE — Progress Notes (Signed)
LOW-RISK PREGNANCY VISIT Patient name: Lauren Guzman MRN [id number]  Date of birth: May 29, 1996 Chief Complaint:   Routine Prenatal Visit and Pregnancy Ultrasound  History of Present Illness:   Lauren Guzman is a 27 y.o. G1P0 female at [redacted]w[redacted]d with an Estimated Date of Delivery: 02/05/23 being seen today for ongoing management of a low-risk pregnancy.   Today she reports no complaints.  Contractions: Not present.  .  Movement: Present. denies leaking of fluid.     07/29/2022    2:43 PM  Depression screen PHQ 2/9  Decreased Interest 0  Down, Depressed, Hopeless 0  PHQ - 2 Score 0  Altered sleeping 0  Tired, decreased energy 0  Change in appetite 0  Feeling bad or failure about yourself  0  Trouble concentrating 0  Moving slowly or fidgety/restless 0  Suicidal thoughts 0  PHQ-9 Score 0        07/29/2022    2:43 PM  GAD 7 : Generalized Anxiety Score  Nervous, Anxious, on Edge 0  Control/stop worrying 0  Worry too much - different things 0  Trouble relaxing 0  Restless 0  Easily annoyed or irritable 1  Afraid - awful might happen 0  Total GAD 7 Score 1      Review of Systems:   Pertinent items are noted in HPI Denies abnormal vaginal discharge w/ itching/odor/irritation, headaches, visual changes, shortness of breath, chest pain, abdominal pain, severe nausea/vomiting, or problems with urination or bowel movements unless otherwise stated above. Pertinent History Reviewed:  Reviewed past medical,surgical, social, obstetrical and family history.  Reviewed problem list, medications and allergies. Physical Assessment:   Vitals:   09/16/22 1553  BP: 115/77  Pulse: 99  Weight: 204 lb (92.5 kg)  Body mass index is 37.31 kg/m.        Physical Examination:   General appearance: Well appearing, and in no distress  Mental status: Alert, oriented to person, place, and time  Skin: Warm & dry  Cardiovascular: Normal heart rate noted  Respiratory: Normal respiratory effort,  no distress  Abdomen: Soft, gravid, nontender  Pelvic: Cervical exam deferred         Extremities: Edema: None  Fetal Status:     Movement: Present  [place] 73+2 wks,cephalic,cx 3 cm,anterior placenta gr 0,normal ovaries,SVP of fluid 4.8 cm, LVEICF X 2,FHR 152 bpm,EFW 339 g 72%,anatomy complete   Chaperone: N/A   No results found for this or any previous visit (from the past 24 hour(s)).  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  Labs/procedures today: U/S and declined flu shot  Plan:  Continue routine obstetrical care  Next visit: prefers in person    Reviewed: Preterm labor symptoms and general obstetric precautions including but not limited to vaginal bleeding, contractions, leaking of fluid and fetal movement were reviewed in detail with the patient.  All questions were answered. Does have home bp cuff. Office bp cuff given: not applicable. Check bp weekly, let us know if consistently >140 and/or >90.  Follow-up: Return in about 4 weeks (around 10/14/2022) for LROB, MD or CNM, in person.  No future appointments.  No orders of the defined types were placed in  this encounter.  Cheral Marker CNM, [facility] 09/16/2022 4:13 PM

## 2022-09-16 NOTE — Patient Instructions (Signed)
Lauren Guzman, thank you for choosing our office today! We appreciate the opportunity to meet your healthcare needs. You may receive a short survey by mail, e-mail, or through [employer]. If you are happy with your care we would appreciate if you could take just a few minutes to complete the survey questions. We read all of your comments and take your feedback very seriously. Thank you again for choosing our office.  Center for [employer] Team at [place] at [place] ([place], [place] [address]) Entrance C, located off of [address] parking  Go to [email or web address] to register for FREE online childbirth classes  Call the office [phone]) or go to [facility] if: You begin to severe cramping Your water breaks.  Sometimes it is a big gush of fluid, sometimes it is just a trickle that keeps getting your panties wet or running down your legs You have vaginal bleeding.  It is normal to have a small amount of spotting if your cervix was checked.   [facility] Pediatricians/Family Doctors [facility]): [address] [place], [facility]: [address] [place], [phone]                [facility]): [place], [phone] (call to ask if accepting patients) [facility] Department: [address], [place], [place] Pediatricians/Family Doctors [facility]): [place]. [place], [address], [place] [facility]: [address] [place], [place] [facility] [place]: [place], [place] [facility]): [phone] [facility]: [address], [place]: [address], [place] [facility]: [phone], [phone]  Home Blood Pressure Monitoring for Patients   Your provider has recommended that you check your blood pressure (BP) at least once a week at home. If you do not have a blood pressure cuff at home, one will be provided for you. Contact your provider if you have not received your monitor within 1 week.   Helpful Tips for Accurate Home Blood Pressure Checks  Don't smoke, exercise, or drink caffeine 30 minutes before checking your BP Use the restroom before checking your BP (a full bladder can raise your pressure) Relax in a comfortable upright chair Feet on the ground Left arm resting comfortably on a flat surface at the level of your heart Legs uncrossed Back supported Sit quietly and don't talk Place the cuff on your bare arm Adjust snuggly, so that only two fingertips can fit between your skin and the top of the cuff Check 2 readings separated by at least one minute Keep a log of your BP readings For a visual, please reference this diagram: http://ccnc.care/bpdiagram  Provider Name: [facility]     Phone: [phone]  Zone 1: ALL CLEAR  Continue to monitor your symptoms:  BP reading is less than 140 (top number) or less than 90 (bottom number)  No right upper stomach pain No headaches or seeing spots No feeling nauseated or throwing up No swelling in face and hands  Zone 2: CAUTION Call your doctor's office for any of the following:  BP reading is greater than 140 (top number) or greater than  90 (bottom number)  Stomach pain under your ribs in the middle or right side Headaches or seeing spots Feeling nauseated or throwing up Swelling in face and hands  Zone 3: EMERGENCY  Seek immediate medical care if you have any of the following:  BP reading is greater than160 (top number) or greater than 110 (bottom number) Severe headaches not improving with Tylenol Serious difficulty catching your breath Any worsening symptoms from  Zone 2     Second Trimester of Pregnancy The second trimester is from week 14 through week 27 (months 4 through 6). The second trimester is often a time when you feel your best. Your body has adjusted to being pregnant, and you begin to feel better physically. Usually, morning sickness has lessened or quit completely, you may have more energy, and you may have an increase in appetite. The second trimester is also a time when the fetus is growing rapidly. At the end of the sixth month, the fetus is about 9 inches long and weighs about 1 pounds. You will likely begin to feel the baby move (quickening) between 16 and 20 weeks of pregnancy. Body changes during your second trimester Your body continues to go through many changes during your second trimester. The changes vary from woman to woman. Your weight will continue to increase. You will notice your lower abdomen bulging out. You may begin to get stretch marks on your hips, abdomen, and breasts. You may develop headaches that can be relieved by medicines. The medicines should be approved by your health care provider. You may urinate more often because the fetus is pressing on your bladder. You may develop or continue to have heartburn as a result of your pregnancy. You may develop constipation because certain hormones are causing the muscles that push waste through your intestines to slow down. You may develop hemorrhoids or swollen, bulging veins (varicose veins). You may have back pain. This is caused by: Weight gain. Pregnancy hormones that are relaxing the joints in your pelvis. A shift in weight and the muscles that support your balance. Your breasts will continue to grow and they will continue to become tender. Your gums may bleed and may be sensitive to brushing and flossing. Dark spots or blotches (chloasma, mask of pregnancy) may develop on your face. This will likely fade after the baby is born. A dark line from your belly button to  the pubic area (linea nigra) may appear. This will likely fade after the baby is born. You may have changes in your hair. These can include thickening of your hair, rapid growth, and changes in texture. Some women also have hair loss during or after pregnancy, or hair that feels dry or thin. Your hair will most likely return to normal after your baby is born.  What to expect at prenatal visits During a routine prenatal visit: You will be weighed to make sure you and the fetus are growing normally. Your blood pressure will be taken. Your abdomen will be measured to track your baby's growth. The fetal heartbeat will be listened to. Any test results from the previous visit will be discussed.  Your health care provider may ask you: How you are feeling. If you are feeling the baby move. If you have had any abnormal symptoms, such as leaking fluid, bleeding, severe headaches, or abdominal cramping. If you are using any tobacco products, including cigarettes, chewing tobacco, and electronic cigarettes. If you have any questions.  Other tests that may be performed during   your second trimester include: Blood tests that check for: Low iron levels (anemia). High blood sugar that affects pregnant women (gestational diabetes) between 24 and 28 weeks. Rh antibodies. This is to check for a protein on red blood cells (Rh factor). Urine tests to check for infections, diabetes, or protein in the urine. An ultrasound to confirm the proper growth and development of the baby. An amniocentesis to check for possible genetic problems. Fetal screens for spina bifida and Down syndrome. HIV (human immunodeficiency virus) testing. Routine prenatal testing includes screening for HIV, unless you choose not to have this test.  Follow these instructions at home: Medicines Follow your health care provider's instructions regarding medicine use. Specific medicines may be either safe or unsafe to take during  pregnancy. Take a prenatal vitamin that contains at least 600 micrograms (mcg) of folic acid. If you develop constipation, try taking a stool softener if your health care provider approves. Eating and drinking Eat a balanced diet that includes fresh fruits and vegetables, whole grains, good sources of protein such as meat, eggs, or tofu, and low-fat dairy. Your health care provider will help you determine the amount of weight gain that is right for you. Avoid raw meat and uncooked cheese. These carry germs that can cause birth defects in the baby. If you have low calcium intake from food, talk to your health care provider about whether you should take a daily calcium supplement. Limit foods that are high in fat and processed sugars, such as fried and sweet foods. To prevent constipation: Drink enough fluid to keep your urine clear or pale yellow. Eat foods that are high in fiber, such as fresh fruits and vegetables, whole grains, and beans. Activity Exercise only as directed by your health care provider. Most women can continue their usual exercise routine during pregnancy. Try to exercise for 30 minutes at least 5 days a week. Stop exercising if you experience uterine contractions. Avoid heavy lifting, wear low heel shoes, and practice good posture. A sexual relationship may be continued unless your health care provider directs you otherwise. Relieving pain and discomfort Wear a good support bra to prevent discomfort from breast tenderness. Take warm sitz baths to soothe any pain or discomfort caused by hemorrhoids. Use hemorrhoid cream if your health care provider approves. Rest with your legs elevated if you have leg cramps or low back pain. If you develop varicose veins, wear support hose. Elevate your feet for 15 minutes, 3-4 times a day. Limit salt in your diet. Prenatal Care Write down your questions. Take them to your prenatal visits. Keep all your prenatal visits as told by your health  care provider. This is important. Safety Wear your seat belt at all times when driving. Make a list of emergency phone numbers, including numbers for family, friends, the hospital, and police and fire departments. General instructions Ask your health care provider for a referral to a local prenatal education class. Begin classes no later than the beginning of month 6 of your pregnancy. Ask for help if you have counseling or nutritional needs during pregnancy. Your health care provider can offer advice or refer you to specialists for help with various needs. Do not use hot tubs, steam rooms, or saunas. Do not douche or use tampons or scented sanitary pads. Do not cross your legs for long periods of time. Avoid cat litter boxes and soil used by cats. These carry germs that can cause birth defects in the baby and possibly loss of the   fetus by miscarriage or stillbirth. Avoid all smoking, herbs, alcohol, and unprescribed drugs. Chemicals in these products can affect the formation and growth of the baby. Do not use any products that contain nicotine or tobacco, such as cigarettes and e-cigarettes. If you need help quitting, ask your health care provider. Visit your dentist if you have not gone yet during your pregnancy. Use a soft toothbrush to brush your teeth and be gentle when you floss. Contact a health care provider if: You have dizziness. You have mild pelvic cramps, pelvic pressure, or nagging pain in the abdominal area. You have persistent nausea, vomiting, or diarrhea. You have a bad smelling vaginal discharge. You have pain when you urinate. Get help right away if: You have a fever. You are leaking fluid from your vagina. You have spotting or bleeding from your vagina. You have severe abdominal cramping or pain. You have rapid weight gain or weight loss. You have shortness of breath with chest pain. You notice sudden or extreme swelling of your face, hands, ankles, feet, or legs. You  have not felt your baby move in over an hour. You have severe headaches that do not go away when you take medicine. You have vision changes. Summary The second trimester is from week 14 through week 27 (months 4 through 6). It is also a time when the fetus is growing rapidly. Your body goes through many changes during pregnancy. The changes vary from woman to woman. Avoid all smoking, herbs, alcohol, and unprescribed drugs. These chemicals affect the formation and growth your baby. Do not use any tobacco products, such as cigarettes, chewing tobacco, and e-cigarettes. If you need help quitting, ask your health care provider. Contact your health care provider if you have any questions. Keep all prenatal visits as told by your health care provider. This is important. This information is not intended to replace advice given to you by your health care provider. Make sure you discuss any questions you have with your health care provider. Document Released: 08/19/2001 Document Revised: 01/31/2016 Document Reviewed: 10/26/2012 Elsevier Interactive Patient Education  2017 [employer]  

## 2022-10-14 ENCOUNTER — Ambulatory Visit (INDEPENDENT_AMBULATORY_CARE_PROVIDER_SITE_OTHER): Payer: Medicaid Other | Admitting: Obstetrics & Gynecology

## 2022-10-14 VITALS — BP 132/83 | HR 84 | Wt 208.0 lb

## 2022-10-14 DIAGNOSIS — Z3A23 23 weeks gestation of pregnancy: Secondary | ICD-10-CM

## 2022-10-14 DIAGNOSIS — Z3482 Encounter for supervision of other normal pregnancy, second trimester: Secondary | ICD-10-CM

## 2022-10-14 NOTE — Progress Notes (Signed)
   LOW-RISK PREGNANCY VISIT Patient name: Lauren Guzman MRN [id number]  Date of birth: 04/24/1996 Chief Complaint:   Routine Prenatal Visit  History of Present Illness:   Lauren Guzman is a 27 y.o. G1P0 female at [redacted]w[redacted]d with an Estimated Date of Delivery: 02/05/23 being seen today for ongoing management of a low-risk pregnancy.     07/29/2022    2:43 PM  Depression screen PHQ 2/9  Decreased Interest 0  Down, Depressed, Hopeless 0  PHQ - 2 Score 0  Altered sleeping 0  Tired, decreased energy 0  Change in appetite 0  Feeling bad or failure about yourself  0  Trouble concentrating 0  Moving slowly or fidgety/restless 0  Suicidal thoughts 0  PHQ-9 Score 0    Today she reports no complaints. Contractions: Not present. Vag. Bleeding: None.  Movement: Present. denies leaking of fluid. Review of Systems:   Pertinent items are noted in HPI Denies abnormal vaginal discharge w/ itching/odor/irritation, headaches, visual changes, shortness of breath, chest pain, abdominal pain, severe nausea/vomiting, or problems with urination or bowel movements unless otherwise stated above. Pertinent History Reviewed:  Reviewed past medical,surgical, social, obstetrical and family history.  Reviewed problem list, medications and allergies. Physical Assessment:   Vitals:   10/14/22 1043  BP: 132/83  Pulse: 84  Weight: 208 lb (94.3 kg)  Body mass index is 38.04 kg/m.        Physical Examination:   General appearance: Well appearing, and in no distress  Mental status: Alert, oriented to person, place, and time  Skin: Warm & dry  Cardiovascular: Normal heart rate noted  Respiratory: Normal respiratory effort, no distress  Abdomen: Soft, gravid, nontender  Pelvic: Cervical exam deferred         Extremities: Edema: None  Fetal Status:     Movement: Present    Chaperone: n/a    No results found for this or any previous visit (from the past 24 hour(s)).  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  Labs/procedures today:   Plan:  Continue routine obstetrical care  Next visit: prefers in person    Reviewed: Preterm labor symptoms and general obstetric precautions including but not limited to vaginal bleeding, contractions, leaking of fluid and fetal movement were reviewed in detail with the patient.  All questions were answered. Has home bp cuff. Rx faxed to . Check bp weekly, let us know if >140/90.   Follow-up: Return in about 4 weeks (around 11/11/2022) for LROB, PN2.  No orders of the defined types were placed in this encounter.   Florian Buff, MD 10/14/2022 11:20 AM

## 2022-11-11 ENCOUNTER — Encounter: Payer: Self-pay | Admitting: Women's Health

## 2022-11-11 ENCOUNTER — Other Ambulatory Visit: Payer: Medicaid Other

## 2022-11-11 ENCOUNTER — Ambulatory Visit (INDEPENDENT_AMBULATORY_CARE_PROVIDER_SITE_OTHER): Payer: Medicaid Other | Admitting: Women's Health

## 2022-11-11 VITALS — BP 125/84 | HR 121 | Wt 219.0 lb

## 2022-11-11 DIAGNOSIS — Z3A27 27 weeks gestation of pregnancy: Secondary | ICD-10-CM

## 2022-11-11 DIAGNOSIS — Z23 Encounter for immunization: Secondary | ICD-10-CM | POA: Diagnosis not present

## 2022-11-11 DIAGNOSIS — Z131 Encounter for screening for diabetes mellitus: Secondary | ICD-10-CM

## 2022-11-11 DIAGNOSIS — Z3402 Encounter for supervision of normal first pregnancy, second trimester: Secondary | ICD-10-CM

## 2022-11-11 NOTE — Progress Notes (Signed)
LOW-RISK PREGNANCY VISIT Patient name: Lauren Guzman MRN [id number]  Date of birth: 06/19/1996 Chief Complaint:   Routine Prenatal Visit (PN2)  History of Present Illness:   Lauren Guzman is a 27 y.o. G1P0 female at 79w5dwith an Estimated Date of Delivery: 02/05/23 being seen today for ongoing management of a low-risk pregnancy.   Today she reports swelling, checks bp's 3x/wk, all wnl. Contractions: Not present.  .  Movement: Present. denies leaking of fluid.     11/11/2022    9:18 AM 07/29/2022    2:43 PM  Depression screen PHQ 2/9  Decreased Interest 0 0  Down, Depressed, Hopeless 0 0  PHQ - 2 Score 0 0  Altered sleeping 0 0  Tired, decreased energy 0 0  Change in appetite 0 0  Feeling bad or failure about yourself  0 0  Trouble concentrating 0 0  Moving slowly or fidgety/restless 0 0  Suicidal thoughts 0 0  PHQ-9 Score 0 0        11/11/2022    9:18 AM 07/29/2022    2:43 PM  GAD 7 : Generalized Anxiety Score  Nervous, Anxious, on Edge 0 0  Control/stop worrying 0 0  Worry too much - different things 0 0  Trouble relaxing 0 0  Restless 0 0  Easily annoyed or irritable 0 1  Afraid - awful might happen 0 0  Total GAD 7 Score 0 1      Review of Systems:   Pertinent items are noted in HPI Denies abnormal vaginal discharge w/ itching/odor/irritation, headaches, visual changes, shortness of breath, chest pain, abdominal pain, severe nausea/vomiting, or problems with urination or bowel movements unless otherwise stated above. Pertinent History Reviewed:  Reviewed past medical,surgical, social, obstetrical and family history.  Reviewed problem list, medications and allergies. Physical Assessment:   Vitals:   11/11/22 0858  BP: 125/84  Pulse: (!) 121  Weight: 219 lb (99.3 kg)  Body mass index is 40.06 kg/m.        Physical Examination:   General appearance: Well appearing, and in no distress  Mental status: Alert, oriented to person, place, and time  Skin: Warm &  dry  Cardiovascular: Normal heart rate noted  Respiratory: Normal respiratory effort, no distress  Abdomen: Soft, gravid, nontender  Pelvic: Cervical exam deferred         Extremities: Edema: Trace  Fetal Status: Fetal Heart Rate (bpm): 143 Fundal Height: 28 cm Movement: Present    Chaperone: N/A   No results found for this or any previous visit (from the past 24 hour(s)).  Assessment & Plan:  1) Low-risk pregnancy G1P0 at 280w5dith an Estimated Date of Delivery: 02/05/23   2) Atypical finding on sex chromosomes on NIPT, had MFM referral, declined diagnostic testing  3) Feet swelling> discussed relief measures, continue checking home bp's, reviewed pre-e s/s, reasons to seek care   Meds: No orders of the defined types were placed in this encounter.  Labs/procedures today: tdap and PN2  Plan:  Continue routine obstetrical care  Next visit: prefers in person    Reviewed: Preterm labor symptoms and general obstetric precautions including but not limited to vaginal bleeding, contractions, leaking of fluid and fetal movement were reviewed in detail with the patient.  All questions were answered. Does have home bp cuff. Office bp cuff given: not applicable. Check bp weekly, let usKoreanow if consistently >140 and/or >90.  Follow-up: Return in about 3 weeks (around 12/02/2022) for  LROB, CNM.  No future appointments.   No orders of the defined types were placed in this encounter.  [place], [facility] 11/11/2022 9:42 AM

## 2022-11-11 NOTE — Patient Instructions (Signed)
Lauren Guzman, thank you for choosing our office today! We appreciate the opportunity to meet your healthcare needs. You may receive a short survey by mail, e-mail, or through [employer]. If you are happy with your care we would appreciate if you could take just a few minutes to complete the survey questions. We read all of your comments and take your feedback very seriously. Thank you again for choosing our office.  Center for [employer] Team at [place] at [facility] ([place], [place] [address]) Entrance C, located off of [address] parking   CLASSES: Go to [email or web address] to register for classes (childbirth, breastfeeding, waterbirth, infant CPR, daddy bootcamp, etc.)  Call the office [phone]) or go to [facility] if: You begin to have strong, frequent contractions Your water breaks.  Sometimes it is a big gush of fluid, sometimes it is just a trickle that keeps getting your panties wet or running down your legs You have vaginal bleeding.  It is normal to have a small amount of spotting if your cervix was checked.  You don't feel your baby moving like normal.  If you don't, get you something to eat and drink and lay down and focus on feeling your baby move.   If your baby is still not moving like normal, you should call the office or go to [facility].  Call the office [phone]) or go to [facility] hospital for these signs of pre-eclampsia: Severe headache that does not go away with Tylenol Visual changes- seeing spots, double, blurred vision Pain under your right breast or upper abdomen that does not go away with Tums or heartburn medicine Nausea and/or vomiting Severe swelling in your hands, feet, and face   Tdap Vaccine It is recommended that you get the Tdap vaccine during the third trimester of EACH pregnancy to help protect your baby from getting pertussis (whooping cough) 27-36 weeks is the BEST time to do  this so that you can pass the protection on to your baby. During pregnancy is better than after pregnancy, but if you are unable to get it during pregnancy it will be offered at the hospital.  You can get this vaccine with [place], at the health department, your family doctor, or some local pharmacies Everyone who will be around your baby should also be up-to-date on their vaccines before the baby comes. Adults (who are not pregnant) only need 1 dose of Tdap during adulthood.   [facility] Pediatricians/Family Doctors [facility]): [address] [place], [facility]: [address] [place], [phone]                [facility]): [place], [phone] (call to ask if accepting patients) [facility] Department: [address], [place], [place] Pediatricians/Family Doctors [facility]): [place] [place], [address], [place] [facility]: [address] [place], [place] [facility]: [place], [place]): [phone] [facility]: [address], [place]: [address] [facility]: [phone], [phone]  Home Blood Pressure Monitoring for Patients   Your provider has recommended that you check your  blood pressure (BP) at least once a week at home. If you do not have a blood pressure cuff at home, one will be provided for you. Contact your provider if you have not received your monitor within 1 week.   Helpful Tips for Accurate Home Blood Pressure Checks  Don't smoke, exercise, or drink caffeine 30 minutes before checking your BP Use the restroom before checking your BP (a full bladder can raise your  pressure) Relax in a comfortable upright chair Feet on the ground Left arm resting comfortably on a flat surface at the level of your heart Legs uncrossed Back supported Sit quietly and don't talk Place the cuff on your bare arm Adjust snuggly, so that only two fingertips can fit between your skin and the top of the cuff Check 2 readings separated by at least one minute Keep a log of your BP readings For a visual, please reference this diagram: http://ccnc.care/bpdiagram  Provider Name: [facility]     Phone: [phone]  Zone 1: ALL CLEAR  Continue to monitor your symptoms:  BP reading is less than 140 (top number) or less than 90 (bottom number)  No right upper stomach pain No headaches or seeing spots No feeling nauseated or throwing up No swelling in face and hands  Zone 2: CAUTION Call your doctor's office for any of the following:  BP reading is greater than 140 (top number) or greater than 90 (bottom number)  Stomach pain under your ribs in the middle or right side Headaches or seeing spots Feeling nauseated or throwing up Swelling in face and hands  Zone 3: EMERGENCY  Seek immediate medical care if you have any of the following:  BP reading is greater than160 (top number) or greater than 110 (bottom number) Severe headaches not improving with Tylenol Serious difficulty catching your breath Any worsening symptoms from Zone 2   Third Trimester of Pregnancy The third trimester is from week 29 through week 42, months 7 through 9. The third trimester is a time when the fetus is growing rapidly. At the end of the ninth month, the fetus is about 20 inches in length and weighs 6-10 pounds.  BODY CHANGES Your body goes through many changes during pregnancy. The changes vary from woman to woman.  Your weight will continue to increase. You can expect to gain 25-35 pounds (11-16 kg) by the end of the pregnancy. You may begin to get stretch marks on your hips, abdomen,  and breasts. You may urinate more often because the fetus is moving lower into your pelvis and pressing on your bladder. You may develop or continue to have heartburn as a result of your pregnancy. You may develop constipation because certain hormones are causing the muscles that push waste through your intestines to slow down. You may develop hemorrhoids or swollen, bulging veins (varicose veins). You may have pelvic pain because of the weight gain and pregnancy hormones relaxing your joints between the bones in your pelvis. Backaches may result from overexertion of the muscles supporting your posture. You may have changes in your hair. These can include thickening of your hair, rapid growth, and changes in texture. Some women also have hair loss during or after pregnancy, or hair that feels dry or thin. Your hair will most likely return to normal after your baby is born. Your breasts will continue to grow and be tender. A yellow discharge may leak from your breasts called colostrum. Your belly button may stick out. You may   feel short of breath because of your expanding uterus. You may notice the fetus "dropping," or moving lower in your abdomen. You may have a bloody mucus discharge. This usually occurs a few days to a week before labor begins. Your cervix becomes thin and soft (effaced) near your due date. WHAT TO EXPECT AT YOUR PRENATAL EXAMS  You will have prenatal exams every 2 weeks until week 36. Then, you will have weekly prenatal exams. During a routine prenatal visit: You will be weighed to make sure you and the fetus are growing normally. Your blood pressure is taken. Your abdomen will be measured to track your baby's growth. The fetal heartbeat will be listened to. Any test results from the previous visit will be discussed. You may have a cervical check near your due date to see if you have effaced. At around 36 weeks, your caregiver will check your cervix. At the same time, your  caregiver will also perform a test on the secretions of the vaginal tissue. This test is to determine if a type of bacteria, Group B streptococcus, is present. Your caregiver will explain this further. Your caregiver may ask you: What your birth plan is. How you are feeling. If you are feeling the baby move. If you have had any abnormal symptoms, such as leaking fluid, bleeding, severe headaches, or abdominal cramping. If you have any questions. Other tests or screenings that may be performed during your third trimester include: Blood tests that check for low iron levels (anemia). Fetal testing to check the health, activity level, and growth of the fetus. Testing is done if you have certain medical conditions or if there are problems during the pregnancy. FALSE LABOR You may feel small, irregular contractions that eventually go away. These are called Braxton Hicks contractions, or false labor. Contractions may last for hours, days, or even weeks before true labor sets in. If contractions come at regular intervals, intensify, or become painful, it is best to be seen by your caregiver.  SIGNS OF LABOR  Menstrual-like cramps. Contractions that are 5 minutes apart or less. Contractions that start on the top of the uterus and spread down to the lower abdomen and back. A sense of increased pelvic pressure or back pain. A watery or bloody mucus discharge that comes from the vagina. If you have any of these signs before the 37th week of pregnancy, call your caregiver right away. You need to go to the hospital to get checked immediately. HOME CARE INSTRUCTIONS  Avoid all smoking, herbs, alcohol, and unprescribed drugs. These chemicals affect the formation and growth of the baby. Follow your caregiver's instructions regarding medicine use. There are medicines that are either safe or unsafe to take during pregnancy. Exercise only as directed by your caregiver. Experiencing uterine cramps is a good sign to  stop exercising. Continue to eat regular, healthy meals. Wear a good support bra for breast tenderness. Do not use hot tubs, steam rooms, or saunas. Wear your seat belt at all times when driving. Avoid raw meat, uncooked cheese, cat litter boxes, and soil used by cats. These carry germs that can cause birth defects in the baby. Take your prenatal vitamins. Try taking a stool softener (if your caregiver approves) if you develop constipation. Eat more high-fiber foods, such as fresh vegetables or fruit and whole grains. Drink plenty of fluids to keep your urine clear or pale yellow. Take warm sitz baths to soothe any pain or discomfort caused by hemorrhoids. Use hemorrhoid cream if   your caregiver approves. If you develop varicose veins, wear support hose. Elevate your feet for 15 minutes, 3-4 times a day. Limit salt in your diet. Avoid heavy lifting, wear low heal shoes, and practice good posture. Rest a lot with your legs elevated if you have leg cramps or low back pain. Visit your dentist if you have not gone during your pregnancy. Use a soft toothbrush to brush your teeth and be gentle when you floss. A sexual relationship may be continued unless your caregiver directs you otherwise. Do not travel far distances unless it is absolutely necessary and only with the approval of your caregiver. Take prenatal classes to understand, practice, and ask questions about the labor and delivery. Make a trial run to the hospital. Pack your hospital bag. Prepare the baby's nursery. Continue to go to all your prenatal visits as directed by your caregiver. SEEK MEDICAL CARE IF: You are unsure if you are in labor or if your water has broken. You have dizziness. You have mild pelvic cramps, pelvic pressure, or nagging pain in your abdominal area. You have persistent nausea, vomiting, or diarrhea. You have a bad smelling vaginal discharge. You have pain with urination. SEEK IMMEDIATE MEDICAL CARE IF:  You  have a fever. You are leaking fluid from your vagina. You have spotting or bleeding from your vagina. You have severe abdominal cramping or pain. You have rapid weight loss or gain. You have shortness of breath with chest pain. You notice sudden or extreme swelling of your face, hands, ankles, feet, or legs. You have not felt your baby move in over an hour. You have severe headaches that do not go away with medicine. You have vision changes. Document Released: 08/19/2001 Document Revised: 08/30/2013 Document Reviewed: 10/26/2012 [facility] Patient Information 2015 [place], [place]. This information is not intended to replace advice given to you by your health care provider. Make sure you discuss any questions you have with your health care provider.

## 2022-11-12 LAB — CBC
Hematocrit: 36.1 % (ref 34.0–46.6)
Hemoglobin: 11.7 g/dL (ref 11.1–15.9)
MCH: 29 pg (ref 26.6–33.0)
MCHC: 32.4 g/dL (ref 31.5–35.7)
MCV: 89 fL (ref 79–97)
Platelets: 206 10*3/uL (ref 150–450)
RBC: 4.04 x10E6/uL (ref 3.77–5.28)
RDW: 12.8 % (ref 11.7–15.4)
WBC: 9.6 10*3/uL (ref 3.4–10.8)

## 2022-11-12 LAB — GLUCOSE TOLERANCE, 2 HOURS W/ 1HR
Glucose, 1 hour: 125 mg/dL (ref 70–179)
Glucose, 2 hour: 100 mg/dL (ref 70–152)
Glucose, Fasting: 78 mg/dL (ref 70–91)

## 2022-11-12 LAB — ANTIBODY SCREEN: Antibody Screen: NEGATIVE

## 2022-11-12 LAB — RPR: RPR Ser Ql: NONREACTIVE

## 2022-11-12 LAB — HIV ANTIBODY (ROUTINE TESTING W REFLEX): HIV Screen 4th Generation wRfx: NONREACTIVE

## 2022-11-25 ENCOUNTER — Encounter (HOSPITAL_COMMUNITY): Payer: Self-pay | Admitting: *Deleted

## 2022-11-25 ENCOUNTER — Emergency Department (HOSPITAL_COMMUNITY)
Admission: EM | Admit: 2022-11-25 | Discharge: 2022-11-25 | Disposition: A | Discharge status: Home / Self Care | Payer: Medicaid Other | Attending: Emergency Medicine | Admitting: Emergency Medicine

## 2022-11-25 ENCOUNTER — Other Ambulatory Visit: Payer: Self-pay

## 2022-11-25 DIAGNOSIS — Z7982 Long term (current) use of aspirin: Secondary | ICD-10-CM | POA: Diagnosis not present

## 2022-11-25 DIAGNOSIS — O99353 Diseases of the nervous system complicating pregnancy, third trimester: Secondary | ICD-10-CM | POA: Diagnosis not present

## 2022-11-25 DIAGNOSIS — G5603 Carpal tunnel syndrome, bilateral upper limbs: Secondary | ICD-10-CM | POA: Insufficient documentation

## 2022-11-25 DIAGNOSIS — O26899 Other specified pregnancy related conditions, unspecified trimester: Secondary | ICD-10-CM

## 2022-11-25 MED ORDER — ACETAMINOPHEN 500 MG PO TABS
1000.0000 mg | ORAL_TABLET | Freq: Once | ORAL | Status: AC
  Administered 2022-11-25: 1000 mg via ORAL
  Filled 2022-11-25: qty 2

## 2022-11-25 NOTE — ED Triage Notes (Signed)
Pt c/o her arms going numb when she lays down. Pt reports if she lays on her right side then her left arm goes numb and vice versa. Usually she reports if she lays on her back the numbness will ease, but not last night, it continued. Pt reports numbness on right arm at this time.   Pt reports she is currently 7 months pregnant, healthy pregnancy, sees [facility], EDD 02/05/23. Denies any abdominal or back pain, vaginal bleeding or discharge.

## 2022-11-25 NOTE — Discharge Instructions (Signed)
We evaluated you for your numbness and tingling.  Your symptoms are likely due to carpal tunnel syndrome.  This is caused by an nerve compression in your wrist.  This can cause numbness and tingling up and down your arms.  This is very common in pregnancy.  We have given you some response in the emergency department.  Please wear these especially at night to sleep, if you do not find these are comfortable you can also buy some carpal tunnel specific wrist splints on [place] or at the pharmacy.  Please take 1000 mg of Tylenol every 6 hours as needed for pain.  You can also ice your wrist to help with your symptoms.  Your symptoms should improve following pregnancy.  Please follow-up with your OB/GYN physician.  If you develop any numbness or tingling in your legs or face, weakness, trouble walking, vision changes, trouble swallowing, speech difficulty, or any other concerning symptoms, please return to the emergency department.

## 2022-11-25 NOTE — ED Provider Notes (Signed)
[facility] Provider Note  CSN: [id number] Arrival date & time: 11/25/22 [id number]  Chief Complaint(s) Numbness  HPI Lauren Guzman is a 27 y.o. female approximately 7 months pregnant presenting to the emergency department with numbness and tingling to both arms.  She reports that it is worse at night.  She reports it is intermittent, sometimes in the right and sometimes in the left.  She denies any numbness or tingling elsewhere in her body, weakness, dizziness, vision changes, facial droop, trouble speaking, trouble swallowing, headaches.  She has not tried anything for her symptoms.  Reports otherwise normal fetal movement, no abdominal pain, no vaginal bleeding or discharge, no dysuria.  She reports that the numbness and tingling has been present throughout her pregnancy.  She presented today because it was worse.   Past Medical History Past Medical History:  Diagnosis Date   Headache    migraines come and go (due to concussion)   History of concussion 03/2017   Patient Active Problem List   Diagnosis Date Noted   Abnormal chromosomal and genetic finding on antenatal screening mother 08/12/2022   Supervision of normal first pregnancy 07/29/2022   Chronic migraine 05/18/2017   Home Medication(s) Prior to Admission medications   Medication Sig Start Date End Date Taking? Authorizing Provider  aspirin EC 81 MG tablet Take 1 tablet (81 mg total) by mouth daily. Swallow whole. 07/29/22   Roma Schanz, CNM  Blood Pressure Monitor MISC For regular home bp monitoring during pregnancy 07/29/22   Roma Schanz, CNM  Prenatal Vit-Fe Fumarate-FA (PRENATAL VITAMIN PO) Take by mouth.    [provider]                                                                                                                                    Past Surgical History Past Surgical History:  Procedure Laterality Date   ARTHROSCOPIC REPAIR ACL      Family History Family History  Problem Relation Age of Onset   Hypertension Mother    Diabetes Father    Hypertension Father    Deep vein thrombosis Maternal Grandmother    Diabetes Maternal Grandfather    Lung cancer Maternal Grandfather    Prostate cancer Maternal Grandfather    Colon cancer Maternal Grandfather    Cancer - Lung Maternal Grandfather    Diabetes Paternal Grandmother    Diabetes Paternal Grandfather     Social History Social History   Tobacco Use   Smoking status: Never   Smokeless tobacco: Never  Vaping Use   Vaping Use: Never used  Substance Use Topics   Alcohol use: No   Drug use: No   Allergies Kiwi extract  Review of Systems Review of Systems  All other systems reviewed and are negative.   Physical Exam Vital Signs  I have reviewed the triage vital signs BP 107/68   Pulse Marland Kitchen)  110   Temp 98.7 F (37.1 C) (Oral)   Resp 18   Ht 5\' 2"  (1.575 m)   Wt 99.3 kg   LMP 05/01/2022   SpO2 100%   BMI 40.06 kg/m  Physical Exam Vitals and nursing note reviewed.  Constitutional:      General: She is not in acute distress.    Appearance: She is well-developed.  HENT:     Head: Normocephalic and atraumatic.     Mouth/Throat:     Mouth: Mucous membranes are moist.  Eyes:     Pupils: Pupils are equal, round, and reactive to light.  Cardiovascular:     Rate and Rhythm: Normal rate and regular rhythm.     Heart sounds: No murmur heard. Pulmonary:     Effort: Pulmonary effort is normal. No respiratory distress.     Breath sounds: Normal breath sounds.  Abdominal:     General: Abdomen is flat.     Palpations: Abdomen is soft.     Tenderness: There is no abdominal tenderness.     Comments: Gravid abdomen  Musculoskeletal:        General: No tenderness.     Right lower leg: No edema.     Left lower leg: No edema.     Comments: Positive Phalen and Tinel signs  Skin:    General: Skin is warm and dry.  Neurological:     Mental Status: She  is alert.     Comments: 5 out of 5 in the bilateral upper and lower extremities.  No sensory deficits to light touch in the bilateral upper or lower extremities.  Cranial nerves II through XII intact  Psychiatric:        Mood and Affect: Mood normal.        Behavior: Behavior normal.     ED Results and Treatments Labs (all labs ordered are listed, but only abnormal results are displayed) Labs Reviewed - No data to display                                                                                                                        Radiology No results found.  Pertinent labs & imaging results that were available during my care of the patient were reviewed by me and considered in my medical decision making (see MDM for details).  Medications Ordered in ED Medications  acetaminophen (TYLENOL) tablet 1,000 mg (1,000 mg Oral Given 11/25/22 0951)  Procedures Procedures  (including critical care time)  Medical Decision Making / ED Course   MDM:  27 year old female presenting to the emergency department with numbness and tingling in both arms.  Physical exam notable for positive Phalen and Tinel sign.  Neurologic exam is otherwise normal.  Low concern for acute intracranial process such as stroke, venous sinus thrombosis given reassuring neurologic exam and bilateral symptoms.  No neck pain to suggest cervical cause of her symptoms.  Presentation consistent with carpal tunnel syndrome of pregnancy.  Will give wrist splints.  Discussed ice therapy as well as Tylenol.  Advise follow-up with OB.  Symptoms should improve after delivery. Heart rate initially mildly elevated so will re-check.  Clinical Course as of 11/25/22 1005  Tue Nov 25, 2022  1003 Heart rate mildly elevated still. Patient reports this is chronic. Heart rate was 121 at last outpatient  visit.  Denies any chest pain, short of breath or other symptoms of this.  She reports she feels at baseline aside from her presenting complaint.  Will discharge, advised close monitoring at home. Will discharge patient to home. All questions answered. Patient comfortable with plan of discharge. Return precautions discussed with patient and specified on the after visit summary.  [WS]    Clinical Course User Index [WS] Cristie Hem, MD     Additional history obtained: -Additional history obtained from spouse -External records from outside source obtained and reviewed including: Chart review including previous notes, labs, imaging, consultation notes including last midwife note 11/11/22     Medicines ordered and prescription drug management: Meds ordered this encounter  Medications   acetaminophen (TYLENOL) tablet 1,000 mg    -I have reviewed the patients home medicines and have made adjustments as needed  Cardiac Monitoring: The patient was maintained on a cardiac monitor.  I personally viewed and interpreted the cardiac monitored which showed an underlying rhythm of: sinus tachycardia  Social Determinants of Health:  Diagnosis or treatment significantly limited by social determinants of health: obesity   Reevaluation: After the interventions noted above, I reevaluated the patient and found that their symptoms have improved  Co morbidities that complicate the patient evaluation  Past Medical History:  Diagnosis Date   Headache    migraines come and go (due to concussion)   History of concussion 03/2017      Dispostion: Disposition decision including need for hospitalization was considered, and patient discharged from emergency department.    Final Clinical Impression(s) / ED Diagnoses Final diagnoses:  Carpal tunnel syndrome during pregnancy     This chart was dictated using voice recognition software.  Despite best efforts to proofread,  errors can occur  which can change the documentation meaning.    Cristie Hem, MD 11/25/22 1005

## 2022-12-02 ENCOUNTER — Ambulatory Visit (INDEPENDENT_AMBULATORY_CARE_PROVIDER_SITE_OTHER): Payer: Medicaid Other | Admitting: Women's Health

## 2022-12-02 ENCOUNTER — Encounter: Payer: Self-pay | Admitting: Women's Health

## 2022-12-02 VITALS — BP 130/82 | HR 102 | Wt 225.4 lb

## 2022-12-02 DIAGNOSIS — Z3A30 30 weeks gestation of pregnancy: Secondary | ICD-10-CM

## 2022-12-02 DIAGNOSIS — Z3403 Encounter for supervision of normal first pregnancy, third trimester: Secondary | ICD-10-CM

## 2022-12-02 NOTE — Patient Instructions (Signed)
Andrena, thank you for choosing our office today! We appreciate the opportunity to meet your healthcare needs. You may receive a short survey by mail, e-mail, or through [employer]. If you are happy with your care we would appreciate if you could take just a few minutes to complete the survey questions. We read all of your comments and take your feedback very seriously. Thank you again for choosing our office.  Center for [employer] Team at [place] at [facility] ([place], [place] [address]) Entrance C, located off of [address] parking   CLASSES: Go to [email or web address] to register for classes (childbirth, breastfeeding, waterbirth, infant CPR, daddy bootcamp, etc.)  Call the office [phone]) or go to [facility] if: You begin to have strong, frequent contractions Your water breaks.  Sometimes it is a big gush of fluid, sometimes it is just a trickle that keeps getting your panties wet or running down your legs You have vaginal bleeding.  It is normal to have a small amount of spotting if your cervix was checked.  You don't feel your baby moving like normal.  If you don't, get you something to eat and drink and lay down and focus on feeling your baby move.   If your baby is still not moving like normal, you should call the office or go to [facility].  Call the office [phone]) or go to [facility] hospital for these signs of pre-eclampsia: Severe headache that does not go away with Tylenol Visual changes- seeing spots, double, blurred vision Pain under your right breast or upper abdomen that does not go away with Tums or heartburn medicine Nausea and/or vomiting Severe swelling in your hands, feet, and face   Tdap Vaccine It is recommended that you get the Tdap vaccine during the third trimester of EACH pregnancy to help protect your baby from getting pertussis (whooping cough) 27-36 weeks is the BEST time to do  this so that you can pass the protection on to your baby. During pregnancy is better than after pregnancy, but if you are unable to get it during pregnancy it will be offered at the hospital.  You can get this vaccine with [place], at the health department, your family doctor, or some local pharmacies Everyone who will be around your baby should also be up-to-date on their vaccines before the baby comes. Adults (who are not pregnant) only need 1 dose of Tdap during adulthood.   [facility] Pediatricians/Family Doctors [facility]): [address] [place], [facility]: [address] [place], [phone]                [facility]): [place], [phone] (call to ask if accepting patients) [facility] Department: [address], [place], [place] Pediatricians/Family Doctors [facility]): [place] [place], [address], [place] [facility]: [address] [place], [place] [facility]: [place], [place] [facility]): [phone] [facility]: [address], [place]: [address] [facility]: [phone], [phone]  Home Blood Pressure Monitoring for Patients   Your provider has recommended that you check your  blood pressure (BP) at least once a week at home. If you do not have a blood pressure cuff at home, one will be provided for you. Contact your provider if you have not received your monitor within 1 week.   Helpful Tips for Accurate Home Blood Pressure Checks  Don't smoke, exercise, or drink caffeine 30 minutes before checking your BP Use the restroom before checking your BP (a full bladder can raise your  pressure) Relax in a comfortable upright chair Feet on the ground Left arm resting comfortably on a flat surface at the level of your heart Legs uncrossed Back supported Sit quietly and don't talk Place the cuff on your bare arm Adjust snuggly, so that only two fingertips can fit between your skin and the top of the cuff Check 2 readings separated by at least one minute Keep a log of your BP readings For a visual, please reference this diagram: http://ccnc.care/bpdiagram  Provider Name: [facility]     Phone: [phone]  Zone 1: ALL CLEAR  Continue to monitor your symptoms:  BP reading is less than 140 (top number) or less than 90 (bottom number)  No right upper stomach pain No headaches or seeing spots No feeling nauseated or throwing up No swelling in face and hands  Zone 2: CAUTION Call your doctor's office for any of the following:  BP reading is greater than 140 (top number) or greater than 90 (bottom number)  Stomach pain under your ribs in the middle or right side Headaches or seeing spots Feeling nauseated or throwing up Swelling in face and hands  Zone 3: EMERGENCY  Seek immediate medical care if you have any of the following:  BP reading is greater than160 (top number) or greater than 110 (bottom number) Severe headaches not improving with Tylenol Serious difficulty catching your breath Any worsening symptoms from Zone 2  Preterm Labor and Birth Information  The normal length of a pregnancy is 39-41 weeks. Preterm labor is when labor starts before 37 completed weeks of pregnancy. What are the risk factors for preterm labor? Preterm labor is more likely to occur in women who: Have certain infections during pregnancy such as a bladder infection, sexually transmitted infection, or infection inside the uterus (chorioamnionitis). Have a shorter-than-normal cervix. Have gone into preterm labor before. Have had surgery on their cervix. Are younger than age 17  or older than age 35. Are African American. Are pregnant with twins or multiple babies (multiple gestation). Take street drugs or smoke while pregnant. Do not gain enough weight while pregnant. Became pregnant shortly after having been pregnant. What are the symptoms of preterm labor? Symptoms of preterm labor include: Cramps similar to those that can happen during a menstrual period. The cramps may happen with diarrhea. Pain in the abdomen or lower back. Regular uterine contractions that may feel like tightening of the abdomen. A feeling of increased pressure in the pelvis. Increased watery or bloody mucus discharge from the vagina. Water breaking (ruptured amniotic sac). Why is it important to recognize signs of preterm labor? It is important to recognize signs of preterm labor because babies who are born prematurely may not be fully developed. This can put them at an increased risk for: Long-term (chronic) heart and lung problems. Difficulty immediately after birth with regulating body systems, including blood sugar, body temperature, heart rate, and breathing rate. Bleeding in the brain. Cerebral palsy. Learning difficulties. Death. These risks are highest for babies who are born before 34 weeks   of pregnancy. How is preterm labor treated? Treatment depends on the length of your pregnancy, your condition, and the health of your baby. It may involve: Having a stitch (suture) placed in your cervix to prevent your cervix from opening too early (cerclage). Taking or being given medicines, such as: Hormone medicines. These may be given early in pregnancy to help support the pregnancy. Medicine to stop contractions. Medicines to help mature the baby's lungs. These may be prescribed if the risk of delivery is high. Medicines to prevent your baby from developing cerebral palsy. If the labor happens before 34 weeks of pregnancy, you may need to stay in the hospital. What should I do if I  think I am in preterm labor? If you think that you are going into preterm labor, call your health care provider right away. How can I prevent preterm labor in future pregnancies? To increase your chance of having a full-term pregnancy: Do not use any tobacco products, such as cigarettes, chewing tobacco, and e-cigarettes. If you need help quitting, ask your health care provider. Do not use street drugs or medicines that have not been prescribed to you during your pregnancy. Talk with your health care provider before taking any herbal supplements, even if you have been taking them regularly. Make sure you gain a healthy amount of weight during your pregnancy. Watch for infection. If you think that you might have an infection, get it checked right away. Make sure to tell your health care provider if you have gone into preterm labor before. This information is not intended to replace advice given to you by your health care provider. Make sure you discuss any questions you have with your health care provider. Document Revised: 12/17/2018 Document Reviewed: 01/16/2016 Elsevier Patient Education  2020 Elsevier Inc.   

## 2022-12-02 NOTE — Progress Notes (Signed)
LOW-RISK PREGNANCY VISIT Patient name: Lauren Guzman MRN [id number]  Date of birth: 1995-12-06 Chief Complaint:   Routine Prenatal Visit  History of Present Illness:   Lauren Guzman is a 27 y.o. G1P0 female at [redacted]w[redacted]d with an Estimated Date of Delivery: 02/05/23 being seen today for ongoing management of a low-risk pregnancy.   Today she reports carpal tunnel symptoms, went to ED, given splints which have helped. Contractions: Not present.  .  Movement: Present. denies leaking of fluid.     11/11/2022    9:18 AM 07/29/2022    2:43 PM  Depression screen PHQ 2/9  Decreased Interest 0 0  Down, Depressed, Hopeless 0 0  PHQ - 2 Score 0 0  Altered sleeping 0 0  Tired, decreased energy 0 0  Change in appetite 0 0  Feeling bad or failure about yourself  0 0  Trouble concentrating 0 0  Moving slowly or fidgety/restless 0 0  Suicidal thoughts 0 0  PHQ-9 Score 0 0        11/11/2022    9:18 AM 07/29/2022    2:43 PM  GAD 7 : Generalized Anxiety Score  Nervous, Anxious, on Edge 0 0  Control/stop worrying 0 0  Worry too much - different things 0 0  Trouble relaxing 0 0  Restless 0 0  Easily annoyed or irritable 0 1  Afraid - awful might happen 0 0  Total GAD 7 Score 0 1      Review of Systems:   Pertinent items are noted in HPI Denies abnormal vaginal discharge w/ itching/odor/irritation, headaches, visual changes, shortness of breath, chest pain, abdominal pain, severe nausea/vomiting, or problems with urination or bowel movements unless otherwise stated above. Pertinent History Reviewed:  Reviewed past medical,surgical, social, obstetrical and family history.  Reviewed problem list, medications and allergies. Physical Assessment:   Vitals:   12/02/22 1019  BP: 130/82  Pulse: (!) 102  Weight: 225 lb 6.4 oz (102.2 kg)  Body mass index is 41.23 kg/m.        Physical Examination:   General appearance: Well appearing, and in no distress  Mental status: Alert, oriented to  person, place, and time  Skin: Warm & dry  Cardiovascular: Normal heart rate noted  Respiratory: Normal respiratory effort, no distress  Abdomen: Soft, gravid, nontender  Pelvic: Cervical exam deferred         Extremities: Edema: Trace  Fetal Status: Fetal Heart Rate (bpm): 158 Fundal Height: 30 cm Movement: Present    Chaperone: N/A   No results found for this or any previous visit (from the past 24 hour(s)).  Assessment & Plan:  1) Low-risk pregnancy G1P0 at [redacted]w[redacted]d with an Estimated Date of Delivery: 02/05/23   2) Atypical finding on sex chromosomes on NIPT, had MFM referral, declined diagnostic testing   3) CTS> continue wrist splints   Meds: No orders of the defined types were placed in this encounter.  Labs/procedures today: none  Plan:  Continue routine obstetrical care  Next visit: prefers in person    Reviewed: Preterm labor symptoms and general obstetric precautions including but not limited to vaginal bleeding, contractions, leaking of fluid and fetal movement were reviewed in detail with the patient.  All questions were answered. Does have home bp cuff. Office bp cuff given: not applicable. Check bp weekly, let us know if consistently >140 and/or >90.  Follow-up: Return in about 2 weeks (around 12/16/2022) for [place], [place], in person.  No future  appointments.  No orders of the defined types were placed in this encounter.  [place], [facility] 12/02/2022 10:47 AM

## 2022-12-16 ENCOUNTER — Ambulatory Visit (INDEPENDENT_AMBULATORY_CARE_PROVIDER_SITE_OTHER): Payer: Medicaid Other | Admitting: Women's Health

## 2022-12-16 ENCOUNTER — Encounter: Payer: Self-pay | Admitting: Women's Health

## 2022-12-16 VITALS — BP 134/84 | HR 111 | Wt 226.0 lb

## 2022-12-16 DIAGNOSIS — Z1389 Encounter for screening for other disorder: Secondary | ICD-10-CM

## 2022-12-16 DIAGNOSIS — Z3402 Encounter for supervision of normal first pregnancy, second trimester: Secondary | ICD-10-CM

## 2022-12-16 DIAGNOSIS — Z3A32 32 weeks gestation of pregnancy: Secondary | ICD-10-CM

## 2022-12-16 DIAGNOSIS — Z331 Pregnant state, incidental: Secondary | ICD-10-CM

## 2022-12-16 DIAGNOSIS — N6310 Unspecified lump in the right breast, unspecified quadrant: Secondary | ICD-10-CM

## 2022-12-16 DIAGNOSIS — Z3403 Encounter for supervision of normal first pregnancy, third trimester: Secondary | ICD-10-CM

## 2022-12-16 LAB — POCT URINALYSIS DIPSTICK OB
Blood, UA: NEGATIVE
Glucose, UA: NEGATIVE
Ketones, UA: NEGATIVE
Nitrite, UA: NEGATIVE

## 2022-12-16 NOTE — Progress Notes (Signed)
LOW-RISK PREGNANCY VISIT Patient name: Lauren Guzman MRN [id number]  Date of birth: 1995/09/12 Chief Complaint:   Routine Prenatal Visit (Rt breast knot)  History of Present Illness:   Lauren Guzman is a 26 y.o. G1P0 female at [redacted]w[redacted]d with an Estimated Date of Delivery: 02/05/23 being seen today for ongoing management of a low-risk pregnancy.   Today she reports  Rt breast mass x 1wk, no pain. . Home bp elevated the other day 140/90, retake was 130s/80s. Took yesterday w/ mom's cuff and was 120s/80s. Denies ha, visual changes, ruq/epigastric pain, n/v.   Contractions: Not present.  .  Movement: Present. denies leaking of fluid.     11/11/2022    9:18 AM 07/29/2022    2:43 PM  Depression screen PHQ 2/9  Decreased Interest 0 0  Down, Depressed, Hopeless 0 0  PHQ - 2 Score 0 0  Altered sleeping 0 0  Tired, decreased energy 0 0  Change in appetite 0 0  Feeling bad or failure about yourself  0 0  Trouble concentrating 0 0  Moving slowly or fidgety/restless 0 0  Suicidal thoughts 0 0  PHQ-9 Score 0 0        11/11/2022    9:18 AM 07/29/2022    2:43 PM  GAD 7 : Generalized Anxiety Score  Nervous, Anxious, on Edge 0 0  Control/stop worrying 0 0  Worry too much - different things 0 0  Trouble relaxing 0 0  Restless 0 0  Easily annoyed or irritable 0 1  Afraid - awful might happen 0 0  Total GAD 7 Score 0 1      Review of Systems:   Pertinent items are noted in HPI Denies abnormal vaginal discharge w/ itching/odor/irritation, headaches, visual changes, shortness of breath, chest pain, abdominal pain, severe nausea/vomiting, or problems with urination or bowel movements unless otherwise stated above. Pertinent History Reviewed:  Reviewed past medical,surgical, social, obstetrical and family history.  Reviewed problem list, medications and allergies. Physical Assessment:   Vitals:   12/16/22 0934 12/16/22 1001  BP: 135/89 134/84  Pulse: (!) 133 (!) 111  Weight: 226 lb (102.5 kg)    Body mass index is 41.34 kg/m.        Physical Examination:   General appearance: Well appearing, and in no distress  Mental status: Alert, oriented to person, place, and time  Skin: Warm & dry  Cardiovascular: Normal heart rate noted  Respiratory: Normal respiratory effort, no distress  Breasts - left breast normal without mass, skin or nipple changes or axillary nodes, Rt breast w/ firm, smooth, mobile, nontender mass 1o'clock 63fb from nipple  Abdomen: Soft, gravid, nontender  Pelvic: Cervical exam deferred         Extremities: Edema: Trace  Fetal Status: Fetal Heart Rate (bpm): 140 Fundal Height: 32 cm Movement: Present    Chaperone: N/A   Results for orders placed or performed in visit on 12/16/22 (from the past 24 hour(s))  POC Urinalysis Dipstick OB   Collection Time: 12/16/22 10:09 AM  Result Value Ref Range   Color, UA     Clarity, UA     Glucose, UA Negative Negative   Bilirubin, UA     Ketones, UA negative    Spec Grav, UA     Blood, UA negative    pH, UA     POC,PROTEIN,UA Trace Negative, Trace, Small (1+), Moderate (2+), Large (3+), 4+   Urobilinogen, UA     Nitrite,  UA negative    Leukocytes, UA Trace (A) Negative   Appearance     Odor      Assessment & Plan:  1) Low-risk pregnancy G1P0 at [redacted]w[redacted]d with an Estimated Date of Delivery: 02/05/23   2) Rt breast mass, will get u/s, orders entered and note routed to [facility] to schedule  3) Borderline bp> repeat a little better, tr proteinuria, asymptomatic. Reviewed pre-e s/s, reasons to seek care. Check home bp's BID, if >140/90 or sx let us know. F/U 1wk instead of 2wks   Meds: No orders of the defined types were placed in this encounter.  Labs/procedures today: none  Plan:  Continue routine obstetrical care  Next visit: prefers in person    Reviewed: Preterm labor symptoms and general obstetric precautions including but not limited to vaginal bleeding, contractions, leaking of fluid and fetal movement were  reviewed in detail with the patient.  All questions were answered. Does have home bp cuff. Office bp cuff given: not applicable. Check bp weekly, let us know if consistently >140 and/or >90.  Follow-up: Return in about 1 week (around 12/23/2022) for LROB, CNM, in person.  Future Appointments  Date Time Provider Department Center  12/23/2022 11:40 AM [facility] US 3 [facility] [place] [facility]  12/24/2022  1:30 PM Arabella Merles, CNM [facility]     Orders Placed This Encounter  Procedures   [place] LIMITED ULTRASOUND INCLUDING AXILLA LEFT BREAST    [place] LIMITED ULTRASOUND INCLUDING AXILLA RIGHT BREAST   POC Urinalysis Dipstick OB   Cheral Marker CNM, [facility] 12/16/2022 10:23 AM

## 2022-12-16 NOTE — Patient Instructions (Signed)
[place], thank you for choosing our office today! We appreciate the opportunity to meet your healthcare needs. You may receive a short survey by mail, e-mail, or through MyChart. If you are happy with your care we would appreciate if you could take just a few minutes to complete the survey questions. We read all of your comments and take your feedback very seriously. Thank you again for choosing our office.  [facility]  [facility] [place] ([address] [place], [place] [address]) Entrance C, located off of [address] Free 24/7 valet parking   CLASSES: Go to [email or web address] to register for classes (childbirth, breastfeeding, waterbirth, infant CPR, daddy bootcamp, etc.)  Call the office ([phone]) or go to [facility] if: You begin to have strong, frequent contractions Your water breaks.  Sometimes it is a big gush of fluid, sometimes it is just a trickle that keeps getting your panties wet or running down your legs You have vaginal bleeding.  It is normal to have a small amount of spotting if your cervix was checked.  You don't feel your baby moving like normal.  If you don't, get you something to eat and drink and lay down and focus on feeling your baby move.   If your baby is still not moving like normal, you should call the office or go to [facility].  Call the office ([phone]) or go to [facility] for these signs of pre-eclampsia: Severe headache that does not go away with Tylenol Visual changes- seeing spots, double, blurred vision Pain under your right breast or upper abdomen that does not go away with Tums or heartburn medicine Nausea and/or vomiting Severe swelling in your hands, feet, and face   Tdap Vaccine It is recommended that you get the Tdap vaccine during the third trimester of EACH pregnancy to help protect your baby from getting pertussis (whooping cough) 27-36 weeks is the BEST time to do  this so that you can pass the protection on to your baby. During pregnancy is better than after pregnancy, but if you are unable to get it during pregnancy it will be offered at the hospital.  You can get this vaccine with us, at the health department, your family doctor, or some local pharmacies Everyone who will be around your baby should also be up-to-date on their vaccines before the baby comes. Adults (who are not pregnant) only need 1 dose of Tdap during adulthood.   [place] Pediatricians/Family Doctors [facility] ([facility]): [address], [phone]           [facility]: [address], [phone]                [facility] ([facility]): [address], [phone] (call to ask if accepting patients) [facility]: [address], [place], [phone]    [place] Pediatricians/Family Doctors [facility] ([facility]): [address], [phone] [facility]: [address], [phone] [facility]: [address], [phone]  [place] [facility]  [facility] ([facility]): [phone] [facility]: [address], [phone]   [place] Family Doctors [facility]: [address], [phone]  [place] [facility]  [facility]: [address], [phone]  Home Blood Pressure Monitoring for Patients   Your provider has recommended that you check your   blood pressure (BP) at least once a week at home. If you do not have a blood pressure cuff at home, one will be provided for you. Contact your provider if you have not received your monitor within 1 week.   Helpful Tips for Accurate Home Blood Pressure Checks  Don't smoke, exercise, or drink caffeine 30 minutes before checking your BP Use the restroom before checking your BP (a full bladder can raise your  pressure) Relax in a comfortable upright chair Feet on the ground Left arm resting comfortably on a flat surface at the level of your heart Legs uncrossed Back supported Sit quietly and don't talk Place the cuff on your bare arm Adjust snuggly, so that only two fingertips can fit between your skin and the top of the cuff Check 2 readings separated by at least one minute Keep a log of your BP readings For a visual, please reference this diagram: http://ccnc.care/bpdiagram  Provider Name: [facility]     Phone: [phone]  Zone 1: ALL CLEAR  Continue to monitor your symptoms:  BP reading is less than 140 (top number) or less than 90 (bottom number)  No right upper stomach pain No headaches or seeing spots No feeling nauseated or throwing up No swelling in face and hands  Zone 2: CAUTION Call your doctor's office for any of the following:  BP reading is greater than 140 (top number) or greater than 90 (bottom number)  Stomach pain under your ribs in the middle or right side Headaches or seeing spots Feeling nauseated or throwing up Swelling in face and hands  Zone 3: EMERGENCY  Seek immediate medical care if you have any of the following:  BP reading is greater than160 (top number) or greater than 110 (bottom number) Severe headaches not improving with Tylenol Serious difficulty catching your breath Any worsening symptoms from Zone 2  Preterm Labor and Birth Information  The normal length of a pregnancy is 39-41 weeks. Preterm labor is when labor starts before 37 completed weeks of pregnancy. What are the risk factors for preterm labor? Preterm labor is more likely to occur in women who: Have certain infections during pregnancy such as a bladder infection, sexually transmitted infection, or infection inside the uterus (chorioamnionitis). Have a shorter-than-normal cervix. Have gone into preterm labor before. Have had surgery on their cervix. Are younger than age 17  or older than age 35. Are African American. Are pregnant with twins or multiple babies (multiple gestation). Take street drugs or smoke while pregnant. Do not gain enough weight while pregnant. Became pregnant shortly after having been pregnant. What are the symptoms of preterm labor? Symptoms of preterm labor include: Cramps similar to those that can happen during a menstrual period. The cramps may happen with diarrhea. Pain in the abdomen or lower back. Regular uterine contractions that may feel like tightening of the abdomen. A feeling of increased pressure in the pelvis. Increased watery or bloody mucus discharge from the vagina. Water breaking (ruptured amniotic sac). Why is it important to recognize signs of preterm labor? It is important to recognize signs of preterm labor because babies who are born prematurely may not be fully developed. This can put them at an increased risk for: Long-term (chronic) heart and lung problems. Difficulty immediately after birth with regulating body systems, including blood sugar, body temperature, heart rate, and breathing rate. Bleeding in the brain. Cerebral palsy. Learning difficulties. Death. These risks are highest for babies who are born before 34 weeks   of pregnancy. How is preterm labor treated? Treatment depends on the length of your pregnancy, your condition, and the health of your baby. It may involve: Having a stitch (suture) placed in your cervix to prevent your cervix from opening too early (cerclage). Taking or being given medicines, such as: Hormone medicines. These may be given early in pregnancy to help support the pregnancy. Medicine to stop contractions. Medicines to help mature the baby's lungs. These may be prescribed if the risk of delivery is high. Medicines to prevent your baby from developing cerebral palsy. If the labor happens before 34 weeks of pregnancy, you may need to stay in the hospital. What should I do if I  think I am in preterm labor? If you think that you are going into preterm labor, call your health care provider right away. How can I prevent preterm labor in future pregnancies? To increase your chance of having a full-term pregnancy: Do not use any tobacco products, such as cigarettes, chewing tobacco, and e-cigarettes. If you need help quitting, ask your health care provider. Do not use street drugs or medicines that have not been prescribed to you during your pregnancy. Talk with your health care provider before taking any herbal supplements, even if you have been taking them regularly. Make sure you gain a healthy amount of weight during your pregnancy. Watch for infection. If you think that you might have an infection, get it checked right away. Make sure to tell your health care provider if you have gone into preterm labor before. This information is not intended to replace advice given to you by your health care provider. Make sure you discuss any questions you have with your health care provider. Document Revised: 12/17/2018 Document Reviewed: 01/16/2016 Elsevier Patient Education  2020 Elsevier Inc.   

## 2022-12-23 ENCOUNTER — Encounter: Payer: Self-pay | Admitting: Women's Health

## 2022-12-23 ENCOUNTER — Ambulatory Visit (HOSPITAL_COMMUNITY)
Admission: RE | Admit: 2022-12-23 | Discharge: 2022-12-23 | Disposition: A | Discharge status: Home / Self Care | Payer: Medicaid Other | Source: Ambulatory Visit | Attending: Women's Health | Admitting: Women's Health

## 2022-12-23 ENCOUNTER — Encounter (HOSPITAL_COMMUNITY): Payer: Self-pay

## 2022-12-23 DIAGNOSIS — Z3A32 32 weeks gestation of pregnancy: Secondary | ICD-10-CM | POA: Diagnosis present

## 2022-12-23 DIAGNOSIS — N6310 Unspecified lump in the right breast, unspecified quadrant: Secondary | ICD-10-CM

## 2022-12-23 DIAGNOSIS — N63 Unspecified lump in unspecified breast: Secondary | ICD-10-CM | POA: Insufficient documentation

## 2022-12-24 ENCOUNTER — Encounter: Payer: Self-pay | Admitting: Advanced Practice Midwife

## 2022-12-24 ENCOUNTER — Ambulatory Visit (INDEPENDENT_AMBULATORY_CARE_PROVIDER_SITE_OTHER): Payer: Medicaid Other | Admitting: Advanced Practice Midwife

## 2022-12-24 VITALS — BP 156/101 | HR 143 | Wt 229.0 lb

## 2022-12-24 DIAGNOSIS — Z3A33 33 weeks gestation of pregnancy: Secondary | ICD-10-CM

## 2022-12-24 DIAGNOSIS — R030 Elevated blood-pressure reading, without diagnosis of hypertension: Secondary | ICD-10-CM

## 2022-12-24 DIAGNOSIS — Z3403 Encounter for supervision of normal first pregnancy, third trimester: Secondary | ICD-10-CM

## 2022-12-24 DIAGNOSIS — Z3402 Encounter for supervision of normal first pregnancy, second trimester: Secondary | ICD-10-CM

## 2022-12-24 NOTE — Progress Notes (Signed)
   LOW-RISK PREGNANCY VISIT Patient name: Lauren Guzman MRN [id number]  Date of birth: 1996-05-19 Chief Complaint:   Routine Prenatal Visit  History of Present Illness:   Lauren Guzman is a 27 y.o. G1P0 female at [redacted]w[redacted]d with an Estimated Date of Delivery: 02/05/23 being seen today for ongoing management of a low-risk pregnancy.  Today she reports  being nervous about her BP being up, but otherwise is doing well . Contractions: Not present.  .  Movement: Present. denies leaking of fluid. Review of Systems:   Pertinent items are noted in HPI Denies abnormal vaginal discharge w/ itching/odor/irritation, headaches, visual changes, shortness of breath, chest pain, abdominal pain, severe nausea/vomiting, or problems with urination or bowel movements unless otherwise stated above. Pertinent History Reviewed:  Reviewed past medical,surgical, social, obstetrical and family history.  Reviewed problem list, medications and allergies. Physical Assessment:   Vitals:   12/24/22 1337 12/24/22 1408  BP: (!) 145/91 (!) 156/101  Pulse: (!) 143 (!) 143  Weight: 229 lb (103.9 kg)   Body mass index is 41.88 kg/m.        Physical Examination:   General appearance: Well appearing, and in no distress  Mental status: Alert, oriented to person, place, and time  Skin: Warm & dry  Cardiovascular: Normal heart rate noted  Respiratory: Normal respiratory effort, no distress  Abdomen: Soft, gravid, nontender  Pelvic: Cervical exam deferred         Extremities: Edema: Trace  Fetal Status: Fetal Heart Rate (bpm): 158 Fundal Height: 34 cm Movement: Present    No results found for this or any previous visit (from the past 24 hour(s)).  Assessment & Plan:  1) Low-risk pregnancy G1P0 at [redacted]w[redacted]d with an Estimated Date of Delivery: 02/05/23   2) ^BP today & very nervous, brought log of daily BPs x 1wk, one DBP 90 and all others nl; asymptomatic; pre-e labs today with BP recheck in 2d; aware of s/s pre-e   Meds: No orders  of the defined types were placed in this encounter.  Labs/procedures today: pre-e labs  Plan:  Continue routine obstetrical care with BP check on 4/19  Reviewed: Preterm labor symptoms and general obstetric precautions including but not limited to vaginal bleeding, contractions, leaking of fluid and fetal movement were reviewed in detail with the patient.  All questions were answered. Has home bp cuff. Check bp weekly, let us know if >140/90.   Follow-up: Return in about 2 days (around 12/26/2022) for LROB (with provider for BP check).  Orders Placed This Encounter  Procedures   CBC   Comp Met (CMET)   Protein / creatinine ratio, urine   Arabella Merles [facility] 12/24/2022 2:11 PM

## 2022-12-25 LAB — CBC
Hematocrit: 38.3 % (ref 34.0–46.6)
Hemoglobin: 12.7 g/dL (ref 11.1–15.9)
MCH: 28.3 pg (ref 26.6–33.0)
MCHC: 33.2 g/dL (ref 31.5–35.7)
MCV: 86 fL (ref 79–97)
Platelets: 223 10*3/uL (ref 150–450)
RBC: 4.48 x10E6/uL (ref 3.77–5.28)
RDW: 12.3 % (ref 11.7–15.4)
WBC: 8.5 10*3/uL (ref 3.4–10.8)

## 2022-12-25 LAB — COMPREHENSIVE METABOLIC PANEL
ALT: 16 IU/L (ref 0–32)
AST: 14 IU/L (ref 0–40)
Albumin/Globulin Ratio: 1.3 (ref 1.2–2.2)
Albumin: 3.7 g/dL — ABNORMAL LOW (ref 4.0–5.0)
Alkaline Phosphatase: 129 IU/L — ABNORMAL HIGH (ref 44–121)
BUN/Creatinine Ratio: 15 (ref 9–23)
BUN: 8 mg/dL (ref 6–20)
Bilirubin Total: <0.2 mg/dL (ref 0.0–1.2)
CO2: 20 mmol/L (ref 20–29)
Calcium: 9.5 mg/dL (ref 8.7–10.2)
Chloride: 102 mmol/L (ref 96–106)
Creatinine, Ser: 0.54 mg/dL — ABNORMAL LOW (ref 0.57–1.00)
Globulin, Total: 2.8 g/dL (ref 1.5–4.5)
Glucose: 103 mg/dL — ABNORMAL HIGH (ref 70–99)
Potassium: 4.4 mmol/L (ref 3.5–5.2)
Sodium: 136 mmol/L (ref 134–144)
Total Protein: 6.5 g/dL (ref 6.0–8.5)
eGFR: 130 mL/min/{1.73_m2} (ref >59)

## 2022-12-25 LAB — PROTEIN / CREATININE RATIO, URINE
Creatinine, Urine: 115.9 mg/dL
Protein, Ur: 16 mg/dL
Protein/Creat Ratio: 138 mg/g creat (ref 0–200)

## 2022-12-26 ENCOUNTER — Ambulatory Visit (INDEPENDENT_AMBULATORY_CARE_PROVIDER_SITE_OTHER): Payer: Medicaid Other | Admitting: Obstetrics & Gynecology

## 2022-12-26 VITALS — BP 133/88 | HR 131 | Wt 228.4 lb

## 2022-12-26 DIAGNOSIS — Z3403 Encounter for supervision of normal first pregnancy, third trimester: Secondary | ICD-10-CM

## 2022-12-26 DIAGNOSIS — Z3A34 34 weeks gestation of pregnancy: Secondary | ICD-10-CM

## 2022-12-26 LAB — POCT URINALYSIS DIPSTICK OB
Blood, UA: NEGATIVE
Glucose, UA: NEGATIVE
Ketones, UA: NEGATIVE
Nitrite, UA: NEGATIVE

## 2022-12-26 NOTE — Progress Notes (Signed)
LOW-RISK PREGNANCY VISIT Patient name: Lauren Guzman MRN [id number]  Date of birth: 1996-04-02 Chief Complaint:   Routine Prenatal Visit  History of Present Illness:   Lauren Guzman is a 27 y.o. G1P0 female at [redacted]w[redacted]d with an Estimated Date of Delivery: 02/05/23 being seen today for ongoing management of a low-risk pregnancy.   Follow-up visit due to elevated BP.  Last visit BP 145/91, repeat 156/101. Patient has been checking her blood pressure at home 120-130/80s.  Reports no BP greater than 140/90.  Reports no acute complaints     11/11/2022    9:18 AM 07/29/2022    2:43 PM  Depression screen PHQ 2/9  Decreased Interest 0 0  Down, Depressed, Hopeless 0 0  PHQ - 2 Score 0 0  Altered sleeping 0 0  Tired, decreased energy 0 0  Change in appetite 0 0  Feeling bad or failure about yourself  0 0  Trouble concentrating 0 0  Moving slowly or fidgety/restless 0 0  Suicidal thoughts 0 0  PHQ-9 Score 0 0    Today she reports no complaints. Contractions: Not present. Vag. Bleeding: None.  Movement: Present. denies leaking of fluid. Review of Systems:   Pertinent items are noted in HPI Denies abnormal vaginal discharge w/ itching/odor/irritation, headaches, visual changes, shortness of breath, chest pain, abdominal pain, severe nausea/vomiting, or problems with urination or bowel movements unless otherwise stated above. Pertinent History Reviewed:  Reviewed past medical,surgical, social, obstetrical and family history.  Reviewed problem list, medications and allergies.  Physical Assessment:   Vitals:   12/26/22 0915  BP: 133/88  Pulse: (!) 131  Weight: 228 lb 6.4 oz (103.6 kg)  Body mass index is 41.77 kg/m.        Physical Examination:   General appearance: Well appearing, and in no distress  Mental status: Alert, oriented to person, place, and time  Skin: Warm & dry  Respiratory: Normal respiratory effort, no distress  Abdomen: Soft, gravid, nontender  Pelvic: Cervical exam  deferred         Extremities: Edema: Trace  Psych:  mood and affect appropriate  Fetal Status: Fetal Heart Rate (bpm): 150   Movement: Present    Chaperone: n/a    Results for orders placed or performed in visit on 12/26/22 (from the past 24 hour(s))  POC Urinalysis Dipstick OB   Collection Time: 12/26/22  9:25 AM  Result Value Ref Range   Color, UA     Clarity, UA     Glucose, UA Negative Negative   Bilirubin, UA     Ketones, UA neg    Spec Grav, UA     Blood, UA neg    pH, UA     POC,PROTEIN,UA Trace Negative, Trace, Small (1+), Moderate (2+), Large (3+), 4+   Urobilinogen, UA     Nitrite, UA neg    Leukocytes, UA Small (1+) (A) Negative   Appearance     Odor       Assessment & Plan:  1) Low-risk pregnancy G1P0 at [redacted]w[redacted]d with an Estimated Date of Delivery: 02/05/23   -BP normal at today's visit -Continue routine OB care -Baby scripts initiated   Meds: No orders of the defined types were placed in this encounter.  Labs/procedures today: UA as above  Plan:  Continue routine obstetrical care  Next visit: prefers in person    Reviewed: Preterm labor symptoms and general obstetric precautions including but not limited to vaginal bleeding, contractions, leaking of fluid  and fetal movement were reviewed in detail with the patient.  All questions were answered. Pt has home bp cuff. Check bp weekly, let us know if >140/90.   Follow-up: Return in about 1 week (around 01/02/2023) for LROB visit.  Orders Placed This Encounter  Procedures   POC Urinalysis Dipstick OB    Myna Hidalgo, DO Attending Obstetrician & Gynecologist, [facility] for [employer], [facility]

## 2022-12-29 ENCOUNTER — Encounter (HOSPITAL_COMMUNITY): Payer: Self-pay | Admitting: Family Medicine

## 2022-12-29 ENCOUNTER — Inpatient Hospital Stay (HOSPITAL_COMMUNITY)
Admission: AD | Admit: 2022-12-29 | Discharge: 2022-12-29 | Disposition: A | Discharge status: Home / Self Care | Payer: Medicaid Other | Attending: Family Medicine | Admitting: Family Medicine

## 2022-12-29 ENCOUNTER — Telehealth: Payer: Self-pay | Admitting: Obstetrics & Gynecology

## 2022-12-29 ENCOUNTER — Telehealth: Payer: Self-pay

## 2022-12-29 ENCOUNTER — Other Ambulatory Visit: Payer: Self-pay

## 2022-12-29 DIAGNOSIS — R030 Elevated blood-pressure reading, without diagnosis of hypertension: Secondary | ICD-10-CM | POA: Insufficient documentation

## 2022-12-29 DIAGNOSIS — O10913 Unspecified pre-existing hypertension complicating pregnancy, third trimester: Secondary | ICD-10-CM | POA: Insufficient documentation

## 2022-12-29 DIAGNOSIS — Z3A34 34 weeks gestation of pregnancy: Secondary | ICD-10-CM | POA: Diagnosis present

## 2022-12-29 DIAGNOSIS — O26893 Other specified pregnancy related conditions, third trimester: Secondary | ICD-10-CM | POA: Insufficient documentation

## 2022-12-29 LAB — URINALYSIS, ROUTINE W REFLEX MICROSCOPIC
Bilirubin Urine: NEGATIVE
Glucose, UA: NEGATIVE mg/dL
Hgb urine dipstick: NEGATIVE
Ketones, ur: 5 mg/dL — AB
Nitrite: NEGATIVE
Protein, ur: 30 mg/dL — AB
Specific Gravity, Urine: 1.019 (ref 1.005–1.030)
pH: 6 (ref 5.0–8.0)

## 2022-12-29 LAB — COMPREHENSIVE METABOLIC PANEL
ALT: 15 U/L (ref 0–44)
AST: 16 U/L (ref 15–41)
Albumin: 2.9 g/dL — ABNORMAL LOW (ref 3.5–5.0)
Alkaline Phosphatase: 113 U/L (ref 38–126)
Anion gap: 8 (ref 5–15)
BUN: 6 mg/dL (ref 6–20)
CO2: 22 mmol/L (ref 22–32)
Calcium: 9 mg/dL (ref 8.9–10.3)
Chloride: 105 mmol/L (ref 98–111)
Creatinine, Ser: 0.54 mg/dL (ref 0.44–1.00)
GFR, Estimated: >60 mL/min (ref >60)
Glucose, Bld: 83 mg/dL (ref 70–99)
Potassium: 4.1 mmol/L (ref 3.5–5.1)
Sodium: 135 mmol/L (ref 135–145)
Total Bilirubin: 0.3 mg/dL (ref 0.3–1.2)
Total Protein: 6.5 g/dL (ref 6.5–8.1)

## 2022-12-29 LAB — CBC
HCT: 36.8 % (ref 36.0–46.0)
Hemoglobin: 12.3 g/dL (ref 12.0–15.0)
MCH: 28.3 pg (ref 26.0–34.0)
MCHC: 33.4 g/dL (ref 30.0–36.0)
MCV: 84.8 fL (ref 80.0–100.0)
Platelets: 199 10*3/uL (ref 150–400)
RBC: 4.34 MIL/uL (ref 3.87–5.11)
RDW: 13.1 % (ref 11.5–15.5)
WBC: 7.7 10*3/uL (ref 4.0–10.5)
nRBC: 0 % (ref 0.0–0.2)

## 2022-12-29 LAB — PROTEIN / CREATININE RATIO, URINE
Creatinine, Urine: 162 mg/dL
Protein Creatinine Ratio: 0.15 mg/mg{Cre} (ref 0.00–0.15)
Total Protein, Urine: 24 mg/dL

## 2022-12-29 NOTE — Telephone Encounter (Signed)
Patient calling stating that her blood pressure is 155/99 and has come down to 137/99 but has checked mulpt times and is staying in the 137/99 or higher asking for someone to call her she states that unable to send my chart message.

## 2022-12-29 NOTE — Telephone Encounter (Signed)
Spoke with patient. She hadn't heard back from [place] so she was already almost to [place]. Stated that she would just go get checked out there. Pt has no other concerns at this time.

## 2022-12-29 NOTE — MAU Note (Signed)
.  Lauren Guzman is a 27 y.o. at [redacted]w[redacted]d here in MAU reporting: Pt reports taking her blood pressure at 0900. Pt reports the reading was 155/94. Pt reports she took it again and it was 146/109.  Onset of complaint: today 0900 Pain score: pt reports carpel tunnel pain. 10/10 There were no vitals filed for this visit.    Lab orders placed from triage:   ua

## 2022-12-29 NOTE — Telephone Encounter (Signed)
Patient would like for a nurse to call her about her blood pressure. It is now 146/109. Wants to know if she should go to the hospital.

## 2022-12-29 NOTE — Telephone Encounter (Signed)
Marchelle Folks returned patient's call.

## 2022-12-29 NOTE — MAU Provider Note (Signed)
History     [id number]  Arrival date and time: 12/29/22 1243    Chief Complaint  Patient presents with   Hypertension     HPI Lauren Guzman is a 27 y.o. G1P0 at [redacted]w[redacted]d by LMP who presents for hypertension. Had elevated BPs in the office last week & has been checking them at home. Had 3 elevated BPs at home this morning (130s-150s/90s-100s).  Denies headache, visual disturbance, or epigastric pain. Denies history of hypertension. Reports good fetal movement.     A/Positive/-- (11/21 1607)  OB History     Gravida  1   Para      Term      Preterm      AB      Living         SAB      IAB      Ectopic      Multiple      Live Births              Past Medical History:  Diagnosis Date   Headache    migraines come and go (due to concussion)   History of concussion 03/2017    Past Surgical History:  Procedure Laterality Date   ARTHROSCOPIC REPAIR ACL      Family History  Problem Relation Age of Onset   Hypertension Mother    Diabetes Father    Hypertension Father    Deep vein thrombosis Maternal Grandmother    Diabetes Maternal Grandfather    Lung cancer Maternal Grandfather    Prostate cancer Maternal Grandfather    Colon cancer Maternal Grandfather    Cancer - Lung Maternal Grandfather    Diabetes Paternal Grandmother    Diabetes Paternal Grandfather     Social History   Socioeconomic History   Marital status: Single    Spouse name: Not on file   Number of children: 0   Years of education: college student   Highest education level: Not on file  Occupational History   Occupation: Archivist  Tobacco Use   Smoking status: Never   Smokeless tobacco: Never  Vaping Use   Vaping Use: Never used  Substance and Sexual Activity   Alcohol use: No   Drug use: No   Sexual activity: Yes    Birth control/protection: None  Other Topics Concern   Not on file  Social History Narrative   Lives at home with her parents.   Occasional  caffeine use.   Right-handed.   Social Determinants of Health   Financial Resource Strain: Low Risk  (07/29/2022)   Overall Financial Resource Strain (CARDIA)    Difficulty of Paying Living Expenses: Not very hard  Food Insecurity: No Food Insecurity (07/29/2022)   Hunger Vital Sign    Worried About Running Out of Food in the Last Year: Never true    Ran Out of Food in the Last Year: Never true  Transportation Needs: No Transportation Needs (07/29/2022)   PRAPARE - Administrator, Civil Service (Medical): No    Lack of Transportation (Non-Medical): No  Physical Activity: Insufficiently Active (07/29/2022)   Exercise Vital Sign    Days of Exercise per Week: 5 days    Minutes of Exercise per Session: 10 min  Stress: No Stress Concern Present (07/29/2022)   [employer] of Occupational Health - Occupational Stress Questionnaire    Feeling of Stress : Not at all  Social Connections: Socially Integrated (07/29/2022)   Social Connection and  Isolation Panel [NHANES]    Frequency of Communication with Friends and Family: More than three times a week    Frequency of Social Gatherings with Friends and Family: More than three times a week    Attends Religious Services: More than 4 times per year    Active Member of [employer] or Organizations: Yes    Attends Banker Meetings: 1 to 4 times per year    Marital Status: Living with partner  Intimate Partner Violence: Not At Risk (07/29/2022)   Humiliation, Afraid, Rape, and Kick questionnaire    Fear of Current or Ex-Partner: No    Emotionally Abused: No    Physically Abused: No    Sexually Abused: No    Allergies  Allergen Reactions   Kiwi Extract     No current facility-administered medications on file prior to encounter.   Current Outpatient Medications on File Prior to Encounter  Medication Sig Dispense Refill   aspirin EC 81 MG tablet Take 1 tablet (81 mg total) by mouth daily. Swallow whole. 90 tablet 3    Prenatal Vit-Fe Fumarate-FA (PRENATAL VITAMIN PO) Take by mouth.     Blood Pressure Monitor MISC For regular home bp monitoring during pregnancy 1 each 0     ROS Pertinent positives and negative per HPI, all others reviewed and negative  Physical Exam   BP 117/80   Pulse (!) 108   Temp 98.2 F (36.8 C)   Resp 12   LMP 05/01/2022   SpO2 99%   Patient Vitals for the past 24 hrs:  BP Temp Pulse Resp SpO2  12/29/22 1530 117/80 -- (!) 108 -- 99 %  12/29/22 1515 116/76 -- (!) 107 -- 100 %  12/29/22 1500 107/77 -- (!) 103 -- 100 %  12/29/22 1445 124/79 -- (!) 119 -- 99 %  12/29/22 1430 119/79 -- (!) 106 -- 99 %  12/29/22 1416 122/74 -- (!) 103 -- --  12/29/22 1401 122/81 -- (!) 108 -- --  12/29/22 1345 128/86 -- (!) 118 -- 99 %  12/29/22 1330 121/81 -- (!) 115 -- 98 %  12/29/22 1319 137/80 -- (!) 132 -- 100 %  12/29/22 1317 -- 98.2 F (36.8 C) -- 12 100 %    Physical Exam Vitals and nursing note reviewed.  Constitutional:      General: She is not in acute distress.    Appearance: She is well-developed. She is not ill-appearing.  HENT:     Head: Normocephalic and atraumatic.  Eyes:     General: No scleral icterus.       Right eye: No discharge.        Left eye: No discharge.     Conjunctiva/sclera: Conjunctivae normal.  Cardiovascular:     Rate and Rhythm: Regular rhythm. Tachycardia present.     Heart sounds: Normal heart sounds.  Pulmonary:     Effort: Pulmonary effort is normal. No respiratory distress.     Breath sounds: Normal breath sounds. No wheezing.  Musculoskeletal:     Right lower leg: 2+ Pitting Edema present.     Left lower leg: 2+ Pitting Edema present.  Neurological:     General: No focal deficit present.     Mental Status: She is alert.     Deep Tendon Reflexes:     Reflex Scores:      Patellar reflexes are 2+ on the right side and 2+ on the left side.    Comments: No clonus  Psychiatric:  Mood and Affect: Mood normal.        Behavior:  Behavior normal.       FHT Baseline 150, moderate variability, 15x15 accels, no decels Toco: Q 11-13 minutes Cat: 1  Labs Results for orders placed or performed during the hospital encounter of 12/29/22 (from the past 24 hour(s))  Urinalysis, Routine w reflex microscopic -Urine, Clean Catch     Status: Abnormal   Collection Time: 12/29/22  1:20 PM  Result Value Ref Range   Color, Urine YELLOW YELLOW   APPearance HAZY (A) CLEAR   Specific Gravity, Urine 1.019 1.005 - 1.030   pH 6.0 5.0 - 8.0   Glucose, UA NEGATIVE NEGATIVE mg/dL   Hgb urine dipstick NEGATIVE NEGATIVE   Bilirubin Urine NEGATIVE NEGATIVE   Ketones, ur 5 (A) NEGATIVE mg/dL   Protein, ur 30 (A) NEGATIVE mg/dL   Nitrite NEGATIVE NEGATIVE   Leukocytes,Ua TRACE (A) NEGATIVE   RBC / HPF 0-5 0 - 5 RBC/hpf   WBC, UA 0-5 0 - 5 WBC/hpf   Bacteria, UA FEW (A) NONE SEEN   Squamous Epithelial / HPF 0-5 0 - 5 /HPF   Mucus PRESENT   Protein / creatinine ratio, urine     Status: None   Collection Time: 12/29/22  1:47 PM  Result Value Ref Range   Creatinine, Urine 162 mg/dL   Total Protein, Urine 24 mg/dL   Protein Creatinine Ratio 0.15 0.00 - 0.15 mg/mg[Cre]  CBC     Status: None   Collection Time: 12/29/22  2:03 PM  Result Value Ref Range   WBC 7.7 4.0 - 10.5 K/uL   RBC 4.34 3.87 - 5.11 MIL/uL   Hemoglobin 12.3 12.0 - 15.0 g/dL   HCT 16.1 09.6 - 04.5 %   MCV 84.8 80.0 - 100.0 fL   MCH 28.3 26.0 - 34.0 pg   MCHC 33.4 30.0 - 36.0 g/dL   RDW 40.9 81.1 - 91.4 %   Platelets 199 150 - 400 K/uL   nRBC 0.0 0.0 - 0.2 %  Comprehensive metabolic panel     Status: Abnormal   Collection Time: 12/29/22  2:03 PM  Result Value Ref Range   Sodium 135 135 - 145 mmol/L   Potassium 4.1 3.5 - 5.1 mmol/L   Chloride 105 98 - 111 mmol/L   CO2 22 22 - 32 mmol/L   Glucose, Bld 83 70 - 99 mg/dL   BUN 6 6 - 20 mg/dL   Creatinine, Ser 7.82 0.44 - 1.00 mg/dL   Calcium 9.0 8.9 - 95.6 mg/dL   Total Protein 6.5 6.5 - 8.1 g/dL   Albumin  2.9 (L) 3.5 - 5.0 g/dL   AST 16 15 - 41 U/L   ALT 15 0 - 44 U/L   Alkaline Phosphatase 113 38 - 126 U/L   Total Bilirubin 0.3 0.3 - 1.2 mg/dL   GFR, Estimated >21 >30 mL/min   Anion gap 8 5 - 15    Imaging No results found.  MAU Course  Procedures Lab Orders         Urinalysis, Routine w reflex microscopic -Urine, Clean Catch         CBC         Comprehensive metabolic panel         Protein / creatinine ratio, urine    No orders of the defined types were placed in this encounter.  Imaging Orders  No imaging studies ordered today    MDM moderate  Assessment and Plan   1. Elevated BP without diagnosis of hypertension   2. [redacted] weeks gestation of pregnancy    -Patient reports elevated BPs at home. Per review of records had elevated BPs in office last week with normal preeclampsia labs. In MAU, she is normotensive & asymptomatic. Preeclampsia labs collected today and remain reassuring. Reviewed preeclampsia precautions & reasons to return to MAU. Patient has ob appt this Friday - encouraged to take her home cuff to that appointment.    #FWB: reactive tracing    Dispo: discharged to home in stable condition.   Discharge Instructions     Discharge patient   Complete by: As directed    Discharge disposition: 01-Home or Self Care   Discharge patient date: 12/29/2022       Judeth Horn, NP 12/29/22 3:46 PM  Allergies as of 12/29/2022       Reactions   Kiwi Extract         Medication List     TAKE these medications    aspirin EC 81 MG tablet Take 1 tablet (81 mg total) by mouth daily. Swallow whole.   Blood Pressure Monitor Misc For regular home bp monitoring during pregnancy   PRENATAL VITAMIN PO Take by mouth.

## 2023-01-02 ENCOUNTER — Ambulatory Visit (INDEPENDENT_AMBULATORY_CARE_PROVIDER_SITE_OTHER): Payer: Medicaid Other | Admitting: Obstetrics & Gynecology

## 2023-01-02 ENCOUNTER — Encounter: Payer: Self-pay | Admitting: Obstetrics & Gynecology

## 2023-01-02 VITALS — BP 130/80 | HR 115 | Wt 231.0 lb

## 2023-01-02 DIAGNOSIS — Z3403 Encounter for supervision of normal first pregnancy, third trimester: Secondary | ICD-10-CM

## 2023-01-02 DIAGNOSIS — Z3A35 35 weeks gestation of pregnancy: Secondary | ICD-10-CM

## 2023-01-02 NOTE — Progress Notes (Signed)
   LOW-RISK PREGNANCY VISIT Patient name: Lauren Guzman MRN [id number]  Date of birth: [birth year] Chief Complaint:   Routine Prenatal Visit  History of Present Illness:   Lauren Guzman is a 27 y.o. G1P0 female at [redacted]w[redacted]d with an Estimated Date of Delivery: 02/05/23 being seen today for ongoing management of a low-risk pregnancy.     11/11/2022    9:18 AM 07/29/2022    2:43 PM  Depression screen PHQ 2/9  Decreased Interest 0 0  Down, Depressed, Hopeless 0 0  PHQ - 2 Score 0 0  Altered sleeping 0 0  Tired, decreased energy 0 0  Change in appetite 0 0  Feeling bad or failure about yourself  0 0  Trouble concentrating 0 0  Moving slowly or fidgety/restless 0 0  Suicidal thoughts 0 0  PHQ-9 Score 0 0    Today she reports no complaints. Contractions: Not present. Vag. Bleeding: None.  Movement: Present. denies leaking of fluid. Review of Systems:   Pertinent items are noted in HPI Denies abnormal vaginal discharge w/ itching/odor/irritation, headaches, visual changes, shortness of breath, chest pain, abdominal pain, severe nausea/vomiting, or problems with urination or bowel movements unless otherwise stated above. Pertinent History Reviewed:  Reviewed past medical,surgical, social, obstetrical and family history.  Reviewed problem list, medications and allergies. Physical Assessment:   Vitals:   01/02/23 1105  BP: 130/80  Pulse: (!) 115  Weight: 231 lb (104.8 kg)  Body mass index is 42.25 kg/m.        Physical Examination:   General appearance: Well appearing, and in no distress  Mental status: Alert, oriented to person, place, and time  Skin: Warm & dry  Cardiovascular: Normal heart rate noted  Respiratory: Normal respiratory effort, no distress  Abdomen: Soft, gravid, nontender  Pelvic: Cervical exam deferred         Extremities: Edema: Trace  Fetal Status:     Movement: Present    Chaperone: n/a    No results found for this or any previous visit (from the past 24  hour(s)).  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  Labs/procedures today:   Plan:  Continue routine obstetrical care  Next visit: prefers in person     Follow-up: Return in about 12 days (around 01/14/2023) for LROB.  No orders of the defined types were placed in this encounter.   Lazaro Arms, MD 01/02/2023 11:33 AM

## 2023-01-09 ENCOUNTER — Encounter: Payer: Self-pay | Admitting: Obstetrics and Gynecology

## 2023-01-09 ENCOUNTER — Other Ambulatory Visit (HOSPITAL_COMMUNITY)
Admission: RE | Admit: 2023-01-09 | Discharge: 2023-01-09 | Disposition: A | Discharge status: Home / Self Care | Payer: Medicaid Other | Source: Ambulatory Visit | Attending: Obstetrics and Gynecology | Admitting: Obstetrics and Gynecology

## 2023-01-09 ENCOUNTER — Ambulatory Visit (INDEPENDENT_AMBULATORY_CARE_PROVIDER_SITE_OTHER): Payer: Medicaid Other | Admitting: Obstetrics and Gynecology

## 2023-01-09 VITALS — BP 130/81 | HR 99 | Wt 232.0 lb

## 2023-01-09 DIAGNOSIS — Z3403 Encounter for supervision of normal first pregnancy, third trimester: Secondary | ICD-10-CM | POA: Insufficient documentation

## 2023-01-09 DIAGNOSIS — Z3A36 36 weeks gestation of pregnancy: Secondary | ICD-10-CM

## 2023-01-09 DIAGNOSIS — O285 Abnormal chromosomal and genetic finding on antenatal screening of mother: Secondary | ICD-10-CM

## 2023-01-09 NOTE — Progress Notes (Signed)
Subjective:  Lauren Guzman is a 27 y.o. G1P0 at [redacted]w[redacted]d being seen today for ongoing prenatal care.  She is currently monitored for the following issues for this low-risk pregnancy and has Chronic migraine; Supervision of normal first pregnancy; Abnormal chromosomal and genetic finding on antenatal screening mother; and Breast mass on their problem list.  Patient reports  general discomforts of pregnancy .  Contractions: Irritability. Vag. Bleeding: None.  Movement: Present. Denies leaking of fluid.   The following portions of the patient's history were reviewed and updated as appropriate: allergies, current medications, past family history, past medical history, past social history, past surgical history and problem list. Problem list updated.  Objective:   Vitals:   01/09/23 0845  BP: 130/81  Pulse: 99  Weight: 232 lb (105.2 kg)    Fetal Status:     Movement: Present     General:  Alert, oriented and cooperative. Patient is in no acute distress.  Skin: Skin is warm and dry. No rash noted.   Cardiovascular: Normal heart rate noted  Respiratory: Normal respiratory effort, no problems with respiration noted  Abdomen: Soft, gravid, appropriate for gestational age. Pain/Pressure: Present     Pelvic:  Cervical exam performed        Extremities: Normal range of motion.  Edema: Trace  Mental Status: Normal mood and affect. Normal behavior. Normal judgment and thought content.   Urinalysis:      Assessment and Plan:  Pregnancy: G1P0 at [redacted]w[redacted]d  1. Encounter for supervision of normal first pregnancy in third trimester Labor precautions - Cervicovaginal ancillary only - Culture, beta strep (group b only)  2. [redacted] weeks gestation of pregnancy  - Cervicovaginal ancillary only - Culture, beta strep (group b only)  3. Abnormal chromosomal and genetic finding on antenatal screening mother stable  Term labor symptoms and general obstetric precautions including but not limited to vaginal  bleeding, contractions, leaking of fluid and fetal movement were reviewed in detail with the patient. Please refer to After Visit Summary for other counseling recommendations.  Return in about 1 week (around 01/16/2023) for OB visit, face to face, any provider.   Hermina Staggers, MD

## 2023-01-12 ENCOUNTER — Encounter: Payer: Self-pay | Admitting: Obstetrics and Gynecology

## 2023-01-12 DIAGNOSIS — O99820 Streptococcus B carrier state complicating pregnancy: Secondary | ICD-10-CM | POA: Insufficient documentation

## 2023-01-12 LAB — CERVICOVAGINAL ANCILLARY ONLY
Chlamydia: NEGATIVE
Comment: NEGATIVE
Comment: NORMAL
Neisseria Gonorrhea: NEGATIVE

## 2023-01-12 LAB — CULTURE, BETA STREP (GROUP B ONLY): Strep Gp B Culture: POSITIVE — AB

## 2023-01-16 ENCOUNTER — Ambulatory Visit (INDEPENDENT_AMBULATORY_CARE_PROVIDER_SITE_OTHER): Payer: Medicaid Other | Admitting: Medical

## 2023-01-16 ENCOUNTER — Encounter: Payer: Self-pay | Admitting: Medical

## 2023-01-16 VITALS — BP 133/85 | HR 100 | Wt 233.4 lb

## 2023-01-16 DIAGNOSIS — O99820 Streptococcus B carrier state complicating pregnancy: Secondary | ICD-10-CM

## 2023-01-16 DIAGNOSIS — O285 Abnormal chromosomal and genetic finding on antenatal screening of mother: Secondary | ICD-10-CM

## 2023-01-16 DIAGNOSIS — Z3A37 37 weeks gestation of pregnancy: Secondary | ICD-10-CM

## 2023-01-16 DIAGNOSIS — Z3403 Encounter for supervision of normal first pregnancy, third trimester: Secondary | ICD-10-CM

## 2023-01-16 NOTE — Progress Notes (Signed)
   PRENATAL VISIT NOTE  Subjective:  Lauren Guzman is a 27 y.o. G1P0 at [redacted]w[redacted]d being seen today for ongoing prenatal care.  She is currently monitored for the following issues for this low-risk pregnancy and has Chronic migraine; Supervision of normal first pregnancy; Abnormal chromosomal and genetic finding on antenatal screening mother; Breast mass; and GBS (group B Streptococcus carrier), +RV culture, currently pregnant on their problem list.  Patient reports fatigue and occasional contractions.  Contractions: Irritability. Vag. Bleeding: None.  Movement: Present. Denies leaking of fluid.   The following portions of the patient's history were reviewed and updated as appropriate: allergies, current medications, past family history, past medical history, past social history, past surgical history and problem list.   Objective:   Vitals:   01/16/23 0832  BP: 133/85  Pulse: 100  Weight: 233 lb 6.4 oz (105.9 kg)    Fetal Status: Fetal Heart Rate (bpm): 148 Fundal Height: 38 cm Movement: Present  Presentation: Vertex  General:  Alert, oriented and cooperative. Patient is in no acute distress.  Skin: Skin is warm and dry. No rash noted.   Cardiovascular: Normal heart rate noted  Respiratory: Normal respiratory effort, no problems with respiration noted  Abdomen: Soft, gravid, appropriate for gestational age.  Pain/Pressure: Present     Pelvic: Cervical exam performed in the presence of a chaperone Dilation: Closed Effacement (%): 50 Station: -3  Extremities: Normal range of motion.  Edema: Trace  Mental Status: Normal mood and affect. Normal behavior. Normal judgment and thought content.   Assessment and Plan:  Pregnancy: G1P0 at [redacted]w[redacted]d 1. Encounter for supervision of normal first pregnancy in third trimester  2. GBS (group B Streptococcus carrier), +RV culture, currently pregnant - discussed with patient, treat in labor   3. Abnormal chromosomal and genetic finding on antenatal  screening mother  4. [redacted] weeks gestation of pregnancy  Term labor symptoms and general obstetric precautions including but not limited to vaginal bleeding, contractions, leaking of fluid and fetal movement were reviewed in detail with the patient. Please refer to After Visit Summary for other counseling recommendations.   No follow-ups on file.  Future Appointments  Date Time Provider Department Center  01/23/2023  8:50 AM Myna Hidalgo, DO [facility]  01/30/2023  8:30 AM Arabella Merles, CNM [facility]  02/06/2023  8:50 AM Myna Hidalgo, DO [facility]    Vonzella Nipple, PA-C

## 2023-01-23 ENCOUNTER — Encounter: Payer: Self-pay | Admitting: Obstetrics & Gynecology

## 2023-01-23 ENCOUNTER — Ambulatory Visit (INDEPENDENT_AMBULATORY_CARE_PROVIDER_SITE_OTHER): Payer: Medicaid Other | Admitting: Obstetrics & Gynecology

## 2023-01-23 VITALS — BP 137/85 | HR 110 | Wt 232.0 lb

## 2023-01-23 DIAGNOSIS — Z3403 Encounter for supervision of normal first pregnancy, third trimester: Secondary | ICD-10-CM

## 2023-01-23 NOTE — Progress Notes (Signed)
   LOW-RISK PREGNANCY VISIT Patient name: Lauren Guzman MRN [id number]  Date of birth: 20-May-1996 Chief Complaint:   Routine Prenatal Visit  History of Present Illness:   Lauren Guzman is a 27 y.o. G1P0 female at [redacted]w[redacted]d with an Estimated Date of Delivery: 02/05/23 being seen today for ongoing management of a low-risk pregnancy.   -GBS pos  -BP high normal, remains 130/80s at home as well -asymptomatic     11/11/2022    9:18 AM 07/29/2022    2:43 PM  Depression screen PHQ 2/9  Decreased Interest 0 0  Down, Depressed, Hopeless 0 0  PHQ - 2 Score 0 0  Altered sleeping 0 0  Tired, decreased energy 0 0  Change in appetite 0 0  Feeling bad or failure about yourself  0 0  Trouble concentrating 0 0  Moving slowly or fidgety/restless 0 0  Suicidal thoughts 0 0  PHQ-9 Score 0 0    Today she reports no complaints. Contractions: Not present. Vag. Bleeding: None.  Movement: Present. denies leaking of fluid. Review of Systems:   Pertinent items are noted in HPI Denies abnormal vaginal discharge w/ itching/odor/irritation, headaches, visual changes, shortness of breath, chest pain, abdominal pain, severe nausea/vomiting, or problems with urination or bowel movements unless otherwise stated above. Pertinent History Reviewed:  Reviewed past medical,surgical, social, obstetrical and family history.  Reviewed problem list, medications and allergies.  Physical Assessment:   Vitals:   01/23/23 0855  BP: 137/85  Pulse: (!) 110  Weight: 232 lb (105.2 kg)  Body mass index is 42.43 kg/m.        Physical Examination:   General appearance: Well appearing, and in no distress  Mental status: Alert, oriented to person, place, and time  Skin: Warm & dry  Respiratory: Normal respiratory effort, no distress  Abdomen: Soft, gravid, nontender  Pelvic: Cervical exam performed  Dilation: Closed Effacement (%): 30 Station: -3  Extremities: Edema: None  Psych:  mood and affect appropriate  Fetal Status:  Fetal Heart Rate (bpm): 150 Fundal Height: 37 cm Movement: Present    Chaperone: n/a    No results found for this or any previous visit (from the past 24 hour(s)).   Assessment & Plan:  1) Low-risk pregnancy G1P0 at [redacted]w[redacted]d with an Estimated Date of Delivery: 02/05/23   2) GBS positive- PCN in labor  -continue to monitor BPs    Meds: No orders of the defined types were placed in this encounter.  Labs/procedures today: none  Plan:  Continue routine obstetrical care  Next visit: prefers in person    Reviewed: Term labor symptoms and general obstetric precautions including but not limited to vaginal bleeding, contractions, leaking of fluid and fetal movement were reviewed in detail with the patient.  All questions were answered. Pt has home bp cuff. Check bp weekly, let us know if >140/90.   Follow-up: Return in about 1 week (around 01/30/2023) for LROB visit.  No orders of the defined types were placed in this encounter.   Myna Hidalgo, DO Attending Obstetrician & Gynecologist, [facility] for [employer], [facility]

## 2023-01-30 ENCOUNTER — Encounter: Payer: Self-pay | Admitting: Advanced Practice Midwife

## 2023-01-30 ENCOUNTER — Ambulatory Visit (INDEPENDENT_AMBULATORY_CARE_PROVIDER_SITE_OTHER): Payer: Medicaid Other | Admitting: Advanced Practice Midwife

## 2023-01-30 VITALS — BP 130/83 | HR 105 | Wt 231.8 lb

## 2023-01-30 DIAGNOSIS — Z3A39 39 weeks gestation of pregnancy: Secondary | ICD-10-CM

## 2023-01-30 NOTE — Progress Notes (Signed)
   LOW-RISK PREGNANCY VISIT Patient name: Lauren Guzman MRN [id number]  Date of birth: 24-Feb-1996 Chief Complaint:   Routine Prenatal Visit  History of Present Illness:   Lauren Guzman is a 27 y.o. G1P0 female at [redacted]w[redacted]d with an Estimated Date of Delivery: 02/05/23 being seen today for ongoing management of a low-risk pregnancy.  Today she reports no complaints. Contractions: Not present. Vag. Bleeding: None.  Movement: Present. denies leaking of fluid. Review of Systems:   Pertinent items are noted in HPI Denies abnormal vaginal discharge w/ itching/odor/irritation, headaches, visual changes, shortness of breath, chest pain, abdominal pain, severe nausea/vomiting, or problems with urination or bowel movements unless otherwise stated above. Pertinent History Reviewed:  Reviewed past medical,surgical, social, obstetrical and family history.  Reviewed problem list, medications and allergies. Physical Assessment:   Vitals:   01/30/23 0834  BP: 130/83  Pulse: (!) 105  Weight: 231 lb 12.8 oz (105.1 kg)  Body mass index is 42.4 kg/m.        Physical Examination:   General appearance: Well appearing, and in no distress  Mental status: Alert, oriented to person, place, and time  Skin: Warm & dry  Cardiovascular: Normal heart rate noted  Respiratory: Normal respiratory effort, no distress  Abdomen: Soft, gravid, nontender  Pelvic: Cervical exam performed  Dilation: Closed Effacement (%): 50 Station: -2  Extremities: Edema: Trace  Fetal Status: Fetal Heart Rate (bpm): 143 Fundal Height: 37 cm Movement: Present Presentation: Vertex  No results found for this or any previous visit (from the past 24 hour(s)).  Assessment & Plan:  1) Low-risk pregnancy G1P0 at [redacted]w[redacted]d with an Estimated Date of Delivery: 02/05/23     Meds: No orders of the defined types were placed in this encounter.  Labs/procedures today: SVE  Plan:  Continue routine obstetrical care   Reviewed: Term labor symptoms and  general obstetric precautions including but not limited to vaginal bleeding, contractions, leaking of fluid and fetal movement were reviewed in detail with the patient.  All questions were answered. Has home bp cuff. Check bp weekly, let us know if >140/90.   Follow-up: Return for make sure there is an NST w 5/31 visit.  No orders of the defined types were placed in this encounter.  Arabella Merles CNM 01/30/2023 8:50 AM

## 2023-02-03 ENCOUNTER — Inpatient Hospital Stay (HOSPITAL_COMMUNITY): Payer: Medicaid Other | Admitting: Anesthesiology

## 2023-02-03 ENCOUNTER — Other Ambulatory Visit: Payer: Self-pay

## 2023-02-03 ENCOUNTER — Encounter (HOSPITAL_COMMUNITY): Payer: Self-pay | Admitting: Obstetrics & Gynecology

## 2023-02-03 ENCOUNTER — Inpatient Hospital Stay (HOSPITAL_COMMUNITY)
Admission: AD | Admit: 2023-02-03 | Discharge: 2023-02-05 | DRG: 807 | Disposition: A | Discharge status: Home / Self Care | Payer: Medicaid Other | Attending: Family Medicine | Admitting: Family Medicine

## 2023-02-03 DIAGNOSIS — Z87820 Personal history of traumatic brain injury: Secondary | ICD-10-CM | POA: Diagnosis not present

## 2023-02-03 DIAGNOSIS — O134 Gestational [pregnancy-induced] hypertension without significant proteinuria, complicating childbirth: Principal | ICD-10-CM | POA: Diagnosis present

## 2023-02-03 DIAGNOSIS — O26893 Other specified pregnancy related conditions, third trimester: Secondary | ICD-10-CM | POA: Diagnosis present

## 2023-02-03 DIAGNOSIS — Z3A39 39 weeks gestation of pregnancy: Secondary | ICD-10-CM | POA: Diagnosis not present

## 2023-02-03 DIAGNOSIS — O99820 Streptococcus B carrier state complicating pregnancy: Secondary | ICD-10-CM | POA: Diagnosis not present

## 2023-02-03 DIAGNOSIS — O99214 Obesity complicating childbirth: Secondary | ICD-10-CM | POA: Diagnosis present

## 2023-02-03 DIAGNOSIS — O99824 Streptococcus B carrier state complicating childbirth: Secondary | ICD-10-CM | POA: Diagnosis present

## 2023-02-03 DIAGNOSIS — Z3403 Encounter for supervision of normal first pregnancy, third trimester: Principal | ICD-10-CM

## 2023-02-03 DIAGNOSIS — O139 Gestational [pregnancy-induced] hypertension without significant proteinuria, unspecified trimester: Secondary | ICD-10-CM | POA: Diagnosis present

## 2023-02-03 HISTORY — DX: Anxiety disorder, unspecified: F41.9

## 2023-02-03 LAB — COMPREHENSIVE METABOLIC PANEL
ALT: 18 U/L (ref 0–44)
AST: 20 U/L (ref 15–41)
Albumin: 3.1 g/dL — ABNORMAL LOW (ref 3.5–5.0)
Alkaline Phosphatase: 212 U/L — ABNORMAL HIGH (ref 38–126)
Anion gap: 13 (ref 5–15)
BUN: 8 mg/dL (ref 6–20)
CO2: 20 mmol/L — ABNORMAL LOW (ref 22–32)
Calcium: 9.6 mg/dL (ref 8.9–10.3)
Chloride: 99 mmol/L (ref 98–111)
Creatinine, Ser: 0.75 mg/dL (ref 0.44–1.00)
GFR, Estimated: >60 mL/min (ref >60)
Glucose, Bld: 106 mg/dL — ABNORMAL HIGH (ref 70–99)
Potassium: 3.9 mmol/L (ref 3.5–5.1)
Sodium: 132 mmol/L — ABNORMAL LOW (ref 135–145)
Total Bilirubin: 0.4 mg/dL (ref 0.3–1.2)
Total Protein: 7.1 g/dL (ref 6.5–8.1)

## 2023-02-03 LAB — PROTEIN / CREATININE RATIO, URINE
Creatinine, Urine: 137 mg/dL
Protein Creatinine Ratio: 0.22 mg/mg{Cre} — ABNORMAL HIGH (ref 0.00–0.15)
Total Protein, Urine: 30 mg/dL

## 2023-02-03 LAB — TYPE AND SCREEN
ABO/RH(D): A POS
Antibody Screen: NEGATIVE

## 2023-02-03 LAB — RPR: RPR Ser Ql: NONREACTIVE

## 2023-02-03 LAB — CBC
HCT: 40.1 % (ref 36.0–46.0)
Hemoglobin: 13.3 g/dL (ref 12.0–15.0)
MCH: 28.6 pg (ref 26.0–34.0)
MCHC: 33.2 g/dL (ref 30.0–36.0)
MCV: 86.2 fL (ref 80.0–100.0)
Platelets: 193 10*3/uL (ref 150–400)
RBC: 4.65 MIL/uL (ref 3.87–5.11)
RDW: 14 % (ref 11.5–15.5)
WBC: 9 10*3/uL (ref 4.0–10.5)
nRBC: 0 % (ref 0.0–0.2)

## 2023-02-03 MED ORDER — ONDANSETRON HCL 4 MG PO TABS: 4.0000 mg | ORAL_TABLET | ORAL | Status: DC | PRN

## 2023-02-03 MED ORDER — FLEET ENEMA 7-19 GM/118ML RE ENEM: 1.0000 | ENEMA | RECTAL | Status: DC | PRN

## 2023-02-03 MED ORDER — PHENYLEPHRINE 80 MCG/ML (10ML) SYRINGE FOR IV PUSH (FOR BLOOD PRESSURE SUPPORT): 80.0000 ug | PREFILLED_SYRINGE | INTRAVENOUS | Status: DC | PRN

## 2023-02-03 MED ORDER — PENICILLIN G POT IN DEXTROSE 60000 UNIT/ML IV SOLN
3.0000 10*6.[IU] | INTRAVENOUS | Status: DC
  Administered 2023-02-03: 3 10*6.[IU] via INTRAVENOUS
  Filled 2023-02-03 (×2): qty 50

## 2023-02-03 MED ORDER — MISOPROSTOL 25 MCG QUARTER TABLET: 25.0000 ug | ORAL_TABLET | Freq: Once | ORAL | Status: DC

## 2023-02-03 MED ORDER — LACTATED RINGERS IV SOLN: INTRAVENOUS | Status: DC

## 2023-02-03 MED ORDER — TETANUS-DIPHTH-ACELL PERTUSSIS 5-2.5-18.5 LF-MCG/0.5 IM SUSY: 0.5000 mL | PREFILLED_SYRINGE | Freq: Once | INTRAMUSCULAR | Status: DC

## 2023-02-03 MED ORDER — ONDANSETRON HCL 4 MG/2ML IJ SOLN: 4.0000 mg | Freq: Four times a day (QID) | INTRAMUSCULAR | Status: DC | PRN

## 2023-02-03 MED ORDER — FENTANYL-BUPIVACAINE-NACL 0.5-0.125-0.9 MG/250ML-% EP SOLN
EPIDURAL | Status: AC
  Filled 2023-02-03: qty 250

## 2023-02-03 MED ORDER — DIPHENHYDRAMINE HCL 25 MG PO CAPS: 25.0000 mg | ORAL_CAPSULE | Freq: Four times a day (QID) | ORAL | Status: DC | PRN

## 2023-02-03 MED ORDER — DIPHENHYDRAMINE HCL 50 MG/ML IJ SOLN: 12.5000 mg | INTRAMUSCULAR | Status: DC | PRN

## 2023-02-03 MED ORDER — OXYCODONE-ACETAMINOPHEN 5-325 MG PO TABS: 1.0000 | ORAL_TABLET | ORAL | Status: DC | PRN

## 2023-02-03 MED ORDER — SENNOSIDES-DOCUSATE SODIUM 8.6-50 MG PO TABS
2.0000 | ORAL_TABLET | ORAL | Status: DC
  Administered 2023-02-04 – 2023-02-05 (×2): 2 via ORAL
  Filled 2023-02-03 (×2): qty 2

## 2023-02-03 MED ORDER — FENTANYL-BUPIVACAINE-NACL 0.5-0.125-0.9 MG/250ML-% EP SOLN
12.0000 mL/h | EPIDURAL | Status: DC | PRN
  Administered 2023-02-03: 12 mL/h via EPIDURAL

## 2023-02-03 MED ORDER — ONDANSETRON HCL 4 MG/2ML IJ SOLN
4.0000 mg | Freq: Once | INTRAMUSCULAR | Status: AC
  Administered 2023-02-03: 4 mg via INTRAVENOUS
  Filled 2023-02-03: qty 2

## 2023-02-03 MED ORDER — COCONUT OIL OIL: 1.0000 | TOPICAL_OIL | Status: DC | PRN

## 2023-02-03 MED ORDER — OXYTOCIN BOLUS FROM INFUSION
333.0000 mL | Freq: Once | INTRAVENOUS | Status: AC
  Administered 2023-02-03: 333 mL via INTRAVENOUS

## 2023-02-03 MED ORDER — LIDOCAINE HCL (PF) 1 % IJ SOLN
INTRAMUSCULAR | Status: DC | PRN
  Administered 2023-02-03: 5 mL via EPIDURAL

## 2023-02-03 MED ORDER — MEASLES, MUMPS & RUBELLA VAC IJ SOLR: 0.5000 mL | Freq: Once | INTRAMUSCULAR | Status: DC

## 2023-02-03 MED ORDER — OXYTOCIN-SODIUM CHLORIDE 30-0.9 UT/500ML-% IV SOLN
2.5000 [IU]/h | INTRAVENOUS | Status: DC
  Filled 2023-02-03: qty 500

## 2023-02-03 MED ORDER — OXYCODONE-ACETAMINOPHEN 5-325 MG PO TABS: 2.0000 | ORAL_TABLET | ORAL | Status: DC | PRN

## 2023-02-03 MED ORDER — DIBUCAINE (PERIANAL) 1 % EX OINT: 1.0000 | TOPICAL_OINTMENT | CUTANEOUS | Status: DC | PRN

## 2023-02-03 MED ORDER — WITCH HAZEL-GLYCERIN EX PADS: 1.0000 | MEDICATED_PAD | CUTANEOUS | Status: DC | PRN

## 2023-02-03 MED ORDER — MISOPROSTOL 50MCG HALF TABLET: 50.0000 ug | ORAL_TABLET | Freq: Once | ORAL | Status: DC

## 2023-02-03 MED ORDER — SOD CITRATE-CITRIC ACID 500-334 MG/5ML PO SOLN: 30.0000 mL | ORAL | Status: DC | PRN

## 2023-02-03 MED ORDER — SODIUM CHLORIDE 0.9 % IV SOLN
5.0000 10*6.[IU] | Freq: Once | INTRAVENOUS | Status: AC
  Administered 2023-02-03: 5 10*6.[IU] via INTRAVENOUS
  Filled 2023-02-03: qty 5

## 2023-02-03 MED ORDER — FUROSEMIDE 20 MG PO TABS
20.0000 mg | ORAL_TABLET | Freq: Every day | ORAL | Status: DC
  Administered 2023-02-03 – 2023-02-05 (×3): 20 mg via ORAL
  Filled 2023-02-03 (×3): qty 1

## 2023-02-03 MED ORDER — OXYTOCIN-SODIUM CHLORIDE 30-0.9 UT/500ML-% IV SOLN
1.0000 m[IU]/min | INTRAVENOUS | Status: DC
  Administered 2023-02-03: 2 m[IU]/min via INTRAVENOUS

## 2023-02-03 MED ORDER — LACTATED RINGERS IV SOLN
500.0000 mL | Freq: Once | INTRAVENOUS | Status: AC
  Administered 2023-02-03: 500 mL via INTRAVENOUS

## 2023-02-03 MED ORDER — ACETAMINOPHEN 325 MG PO TABS: 650.0000 mg | ORAL_TABLET | ORAL | Status: DC | PRN

## 2023-02-03 MED ORDER — SIMETHICONE 80 MG PO CHEW: 80.0000 mg | CHEWABLE_TABLET | ORAL | Status: DC | PRN

## 2023-02-03 MED ORDER — FENTANYL CITRATE (PF) 100 MCG/2ML IJ SOLN
100.0000 ug | INTRAMUSCULAR | Status: DC | PRN
  Administered 2023-02-03: 100 ug via INTRAVENOUS
  Filled 2023-02-03: qty 2

## 2023-02-03 MED ORDER — BENZOCAINE-MENTHOL 20-0.5 % EX AERO
1.0000 | INHALATION_SPRAY | CUTANEOUS | Status: DC | PRN
  Filled 2023-02-03: qty 56

## 2023-02-03 MED ORDER — OXYCODONE HCL 5 MG PO TABS: 5.0000 mg | ORAL_TABLET | ORAL | Status: DC | PRN

## 2023-02-03 MED ORDER — EPHEDRINE 5 MG/ML INJ: 10.0000 mg | INTRAVENOUS | Status: DC | PRN

## 2023-02-03 MED ORDER — LACTATED RINGERS IV SOLN: 500.0000 mL | INTRAVENOUS | Status: DC | PRN

## 2023-02-03 MED ORDER — IBUPROFEN 600 MG PO TABS
600.0000 mg | ORAL_TABLET | Freq: Four times a day (QID) | ORAL | Status: DC
  Administered 2023-02-03 – 2023-02-05 (×8): 600 mg via ORAL
  Filled 2023-02-03 (×8): qty 1

## 2023-02-03 MED ORDER — ZOLPIDEM TARTRATE 5 MG PO TABS: 5.0000 mg | ORAL_TABLET | Freq: Every evening | ORAL | Status: DC | PRN

## 2023-02-03 MED ORDER — LIDOCAINE HCL (PF) 1 % IJ SOLN: 30.0000 mL | INTRAMUSCULAR | Status: DC | PRN

## 2023-02-03 MED ORDER — ONDANSETRON HCL 4 MG/2ML IJ SOLN: 4.0000 mg | INTRAMUSCULAR | Status: DC | PRN

## 2023-02-03 MED ORDER — LIDOCAINE-EPINEPHRINE (PF) 1.5 %-1:200000 IJ SOLN
INTRAMUSCULAR | Status: DC | PRN
  Administered 2023-02-03: 5 mL via EPIDURAL

## 2023-02-03 MED ORDER — TERBUTALINE SULFATE 1 MG/ML IJ SOLN: 0.2500 mg | Freq: Once | INTRAMUSCULAR | Status: DC | PRN

## 2023-02-03 MED ORDER — PRENATAL MULTIVITAMIN CH
1.0000 | ORAL_TABLET | Freq: Every day | ORAL | Status: DC
  Administered 2023-02-04 – 2023-02-05 (×2): 1 via ORAL
  Filled 2023-02-03 (×2): qty 1

## 2023-02-03 NOTE — Progress Notes (Signed)
Patient ID: Lauren Guzman, female   DOB: Jun 12, 1996, 27 y.o.   MRN: [id number]  Contracted overnight; denies s/s pre-e; Pitocin started @ 0900; mild range BP elevations overnight  BPs 144/89, 140/93 FHR 125-130s, +accels, no decels, Cat 1 Ctx irreg 2-6 mins Cx per RN 3-4/90/post vtx -2 @ 0847  Pre-e labs neg  IUP@39 .5wks Latent labor New onset gHTN  Continue uptitrating Pit to achieve active labor Anticipate vag del  Arabella Merles [facility] 02/03/2023 9:59 AM

## 2023-02-03 NOTE — H&P (Signed)
Lauren Guzman is a 27 y.o. female presenting for contractions.  Denies leaking or bleeding and reports positive fetal movement  While being evaluated for labor, BPs noted to be elevated   There was an elevated BP in April on office note.  So this qualifies as Gestational Hypertension for now..  RN Note: Lauren Guzman is a 27 y.o. at [redacted]w[redacted]d here in MAU reporting:   Contractions every: 5 minutes Onset of ctx: Today Pain score: 7/10               ROM: Intact Vaginal Bleeding: None   Last SVE: 0  OB History     Gravida  1   Para      Term      Preterm      AB      Living         SAB      IAB      Ectopic      Multiple      Live Births             Past Medical History:  Diagnosis Date   Anxiety    Headache    migraines come and go (due to concussion)   History of concussion 03/2017   Past Surgical History:  Procedure Laterality Date   ARTHROSCOPIC REPAIR ACL     Family History: family history includes Cancer - Lung in her maternal grandfather; Colon cancer in her maternal grandfather; Deep vein thrombosis in her maternal grandmother; Diabetes in her father, maternal grandfather, paternal grandfather, and paternal grandmother; Hypertension in her father and mother; Lung cancer in her maternal grandfather; Prostate cancer in her maternal grandfather. Social History:  reports that she has never smoked. She has never used smokeless tobacco. She reports that she does not drink alcohol and does not use drugs.     Maternal Diabetes: No Genetic Screening: Normal Maternal Ultrasounds/Referrals: Normal Fetal Ultrasounds or other Referrals:  None Maternal Substance Abuse:  No Significant Maternal Medications:  None Significant Maternal Lab Results:  Group B Strep positive Number of Prenatal Visits:greater than 3 verified prenatal visits Other Comments:  None  Review of Systems  Constitutional:  Negative for chills and fever.  Eyes:  Negative for visual disturbance.   Respiratory:  Negative for chest tightness and shortness of breath.   Gastrointestinal:  Positive for abdominal pain. Negative for nausea.  Neurological:  Negative for headaches.   Maternal Medical History:  Reason for admission: Contractions.  Nausea.  Contractions: Onset was 1-2 hours ago.   Frequency: irregular.   Perceived severity is moderate.   Fetal activity: Perceived fetal activity is normal.   Prenatal complications: PIH.   No bleeding, placental abnormality or preterm labor.   Prenatal Complications - Diabetes: none.   Dilation: 1.5 Effacement (%): 60 Station: -2 Exam by:: [employer] RN Blood pressure (!) 144/89, pulse 92, temperature 98.6 F (37 C), temperature source Oral, resp. rate 18, height 5\' 2"  (1.575 m), weight 108.2 kg, last menstrual period 05/01/2022, SpO2 100 %. Maternal Exam:  Uterine Assessment: Contraction strength is moderate.  Contraction frequency is irregular.  Abdomen: Patient reports no abdominal tenderness. Fundal height is 38.   Estimated fetal weight is 7.   Introitus: Normal vulva. Normal vagina.  Ferning test: not done.  Nitrazine test: not done. Pelvis: adequate for delivery.   Cervix: Cervix evaluated by digital exam.     Fetal Exam Fetal Monitor Review: Mode: ultrasound.   Baseline rate: 140.  Variability: moderate (6-25 bpm).   Pattern: no decelerations and accelerations present.   Fetal State Assessment: Category I - tracings are normal.   Physical Exam Constitutional:      General: She is not in acute distress.    Appearance: She is not ill-appearing or toxic-appearing.  HENT:     Head: Normocephalic.  Cardiovascular:     Rate and Rhythm: Normal rate.  Pulmonary:     Effort: Pulmonary effort is normal.  Abdominal:     General: There is no distension.     Tenderness: There is no abdominal tenderness.  Genitourinary:    General: Normal vulva.     Comments: Dilation: 1.5 Effacement (%): 60 Station:  -2 Presentation: Vertex Exam by:: Boone Master RN  Musculoskeletal:        General: Normal range of motion.     Cervical back: Normal range of motion.  Skin:    General: Skin is warm and dry.  Neurological:     General: No focal deficit present.     Mental Status: She is alert.  Psychiatric:        Mood and Affect: Mood normal.     Results for orders placed or performed during the hospital encounter of 02/03/23 (from the past 24 hour(s))  Type and screen [facility]     Status: None   Collection Time: 02/03/23  5:43 AM  Result Value Ref Range   ABO/RH(D) A POS    Antibody Screen NEG    Sample Expiration      02/06/2023,2359 Performed at [facility], [address] [place], [place] [address]   CBC     Status: None   Collection Time: 02/03/23  5:44 AM  Result Value Ref Range   WBC 9.0 4.0 - 10.5 K/uL   RBC 4.65 3.87 - 5.11 MIL/uL   Hemoglobin 13.3 12.0 - 15.0 g/dL   HCT 60.4 54.0 - 98.1 %   MCV 86.2 80.0 - 100.0 fL   MCH 28.6 26.0 - 34.0 pg   MCHC 33.2 30.0 - 36.0 g/dL   RDW 19.1 47.8 - 29.5 %   Platelets 193 150 - 400 K/uL   nRBC 0.0 0.0 - 0.2 %  RPR     Status: None   Collection Time: 02/03/23  5:44 AM  Result Value Ref Range   RPR Ser Ql NON REACTIVE NON REACTIVE  Comprehensive metabolic panel     Status: Abnormal   Collection Time: 02/03/23  5:44 AM  Result Value Ref Range   Sodium 132 (L) 135 - 145 mmol/L   Potassium 3.9 3.5 - 5.1 mmol/L   Chloride 99 98 - 111 mmol/L   CO2 20 (L) 22 - 32 mmol/L   Glucose, Bld 106 (H) 70 - 99 mg/dL   BUN 8 6 - 20 mg/dL   Creatinine, Ser 6.21 0.44 - 1.00 mg/dL   Calcium 9.6 8.9 - 30.8 mg/dL   Total Protein 7.1 6.5 - 8.1 g/dL   Albumin 3.1 (L) 3.5 - 5.0 g/dL   AST 20 15 - 41 U/L   ALT 18 0 - 44 U/L   Alkaline Phosphatase 212 (H) 38 - 126 U/L   Total Bilirubin 0.4 0.3 - 1.2 mg/dL   GFR, Estimated >65 >78 mL/min   Anion gap 13 5 - 15  Protein / creatinine ratio, urine     Status: Abnormal    Collection Time: 02/03/23  6:59 AM  Result Value Ref Range  Creatinine, Urine 137 mg/dL   Total Protein, Urine 30 mg/dL   Protein Creatinine Ratio 0.22 (H) 0.00 - 0.15 mg/mg[Cre]    Prenatal labs: ABO, Rh: --/--/A POS (05/28 0543) Antibody: NEG (05/28 0543) Rubella: 4.76 (11/21 1607) RPR: NON REACTIVE (05/28 0544)  HBsAg: Negative (11/21 1607)  HIV: Non Reactive (03/05 0814)  GBS: Positive/-- (05/03 1400)   Assessment/Plan: Single IUP at [redacted]w[redacted]d Uterine contractions Prodromal vs Latent Labor New Gestational Hypertension  Admit to Labor and Delivery Routine orders Will augment labor with Pitocin.   Wynelle Bourgeois 02/03/2023, 7:49 AM

## 2023-02-03 NOTE — Anesthesia Preprocedure Evaluation (Signed)
Anesthesia Evaluation  Patient identified by MRN, date of birth, ID band Patient awake    Reviewed: Allergy & Precautions, H&P , NPO status , Patient's Chart, lab work & pertinent test results  History of Anesthesia Complications Negative for: history of anesthetic complications  Airway Mallampati: II  TM Distance: >3 FB Neck ROM: full    Dental no notable dental hx. (+) Teeth Intact   Pulmonary neg pulmonary ROS   Pulmonary exam normal breath sounds clear to auscultation       Cardiovascular negative cardio ROS Normal cardiovascular exam Rhythm:regular Rate:Normal     Neuro/Psych  Headaches  Anxiety      negative psych ROS   GI/Hepatic negative GI ROS, Neg liver ROS,,,  Endo/Other    Morbid obesity  Renal/GU negative Renal ROS  negative genitourinary   Musculoskeletal   Abdominal  (+) + obese  Peds  Hematology negative hematology ROS (+)   Anesthesia Other Findings   Reproductive/Obstetrics (+) Pregnancy                             Anesthesia Physical Anesthesia Plan  ASA: 3  Anesthesia Plan: Epidural   Post-op Pain Management:    Induction:   PONV Risk Score and Plan:   Airway Management Planned:   Additional Equipment:   Intra-op Plan:   Post-operative Plan:   Informed Consent: I have reviewed the patients History and Physical, chart, labs and discussed the procedure including the risks, benefits and alternatives for the proposed anesthesia with the patient or authorized representative who has indicated his/her understanding and acceptance.       Plan Discussed with:   Anesthesia Plan Comments:        Anesthesia Quick Evaluation

## 2023-02-03 NOTE — Anesthesia Procedure Notes (Signed)
Epidural Patient location during procedure: OB Start time: 02/03/2023 10:17 AM End time: 02/03/2023 10:27 AM  Staffing Anesthesiologist: Leonides Grills, MD Performed: anesthesiologist   Preanesthetic Checklist Completed: patient identified, IV checked, site marked, risks and benefits discussed, monitors and equipment checked, pre-op evaluation and timeout performed  Epidural Patient position: sitting Prep: DuraPrep Patient monitoring: heart rate, cardiac monitor, continuous pulse ox and blood pressure Approach: midline Location: L3-L4 Injection technique: LOR air  Needle:  Needle type: Tuohy  Needle gauge: 17 G Needle length: 9 cm Needle insertion depth: 7 cm Catheter type: closed end flexible Catheter size: 19 Gauge Catheter at skin depth: 13 cm Test dose: negative and 1.5% lidocaine with Epi 1:200 K  Assessment Events: blood not aspirated, no cerebrospinal fluid, injection not painful, no injection resistance and negative IV test  Additional Notes Informed consent obtained prior to proceeding including risk of failure, 1% risk of PDPH, risk of minor discomfort and bruising. Discussed alternatives to epidural analgesia and patient desires to proceed.  Timeout performed pre-procedure verifying patient name, procedure, and platelet count.  Patient tolerated procedure well. Reason for block:procedure for pain

## 2023-02-03 NOTE — MAU Note (Signed)
.  Lauren Guzman is a 27 y.o. at [redacted]w[redacted]d here in MAU reporting:   Contractions every: 5 minutes Onset of ctx: Today Pain score: 7/10  ROM: Intact Vaginal Bleeding: None Last SVE: 0  Epidural: Planning  Fetal Movement: Reports positive FM FHT:152 via External  Vitals:   02/03/23 0456  BP: 126/86  Pulse: (!) 101  Resp: 20  Temp: 98.1 F (36.7 C)  SpO2: 100%       OB Office: Faculty GBS: Positive HSV: Denies hx of HSV Lab orders placed from triage: MAU Labor Eval

## 2023-02-03 NOTE — Progress Notes (Addendum)
Patient ID: Lauren Guzman, female   DOB: 10/04/1995, 27 y.o.   MRN: [id number]  Comfortable w epidural  BPs 112/70, 116/74, 121/65 FHR 130-140s, +accels, no decels, occ mi variables Ctx q 2-3 mins with Pit @ 85mu/min Cx 6-7/90/vtx -1; AROM for light MSF  IUP@39 .5wks Active labor gHTN  Continue to keep ctx reg with Pitocin Anticipate vag delivery  Lauren Guzman CNM 02/03/2023 1:52 PM

## 2023-02-03 NOTE — Lactation Note (Signed)
This note was copied from a baby's chart. Lactation Consultation Note  Patient Name: Lauren Guzman [id number] Date: 02/03/2023 Age:27 hours  Reason for consult: Initial assessment;Term;Primapara;1st time breastfeeding;Breastfeeding assistance  P1, GA [redacted]w[redacted]d  Called to labor and delivery to assist with breastfeeding. Upon arrival to room, baby was latched well on mom's breast with wide gape and intermittent sucking. Slight breast compression or stimulation triggered baby to actively suckle with bursts of swallows. When baby came off breast spontaneously, baby would eagerly re-latch with mother guiding him on to breast. Discussed feeding baby with feeding cues, requesting help as needed, and skin to skin if baby is not latching.   Mother reports that she has been leaking and has had several "clogged ducts" in the last 2 months prior to her delivery. She said she was usually able to massage the "clogged/firm" area and it would resolve. She states she did see a doctor (ED?) about 2 months ago when the clogged duct in her breast was about the "size of a quarter". Instructed mother to inform nursing or medical providers if she has any additional firm areas in breast while in hospital or after discharge.   Mom made aware of O/P services, breastfeeding support groups, community resources, and our phone # for post-discharge questions.    Maternal Data Has patient been taught Hand Expression?: Yes Does the patient have breastfeeding experience prior to this delivery?: No  Feeding Mother's Current Feeding Choice: Breast Milk  LATCH Score Latch: Grasps breast easily, tongue down, lips flanged, rhythmical sucking.  Audible Swallowing: Spontaneous and intermittent  Type of Nipple: Everted at rest and after stimulation  Comfort (Breast/Nipple): Soft / non-tender  Hold (Positioning): Assistance needed to correctly position infant at breast and maintain latch.  LATCH Score: 9       Interventions Interventions: Assisted with latch;Hand express;Breast compression;Education;LC Services brochure  Discharge Pump: DEBP;Personal (mother states it has been ordered and in route for delivery)  Consult Status Consult Status: Follow-up Date: 02/04/23 Follow-up type: In-patient    [name] M 02/03/2023, 6:26 PM

## 2023-02-03 NOTE — Discharge Summary (Signed)
Postpartum Discharge Summary  Date of Service updated***     Patient Name: Lauren Guzman DOB: 1995/09/13 MRN: [id number]  Date of admission: 02/03/2023 Delivery date:02/03/2023  Delivering provider: Cam Hai D  Date of discharge: 02/04/2023  Admitting diagnosis: Normal labor [O80, Z37.9] Intrauterine pregnancy: [redacted]w[redacted]d     Secondary diagnosis:  Principal Problem:   Normal labor Active Problems:   GBS (group B Streptococcus carrier), +RV culture, currently pregnant   Gestational hypertension  Additional problems: none    Discharge diagnosis: Term Pregnancy Delivered and Gestational Hypertension                                              Post partum procedures: none Augmentation: AROM and Pitocin Complications: None  Hospital course: Onset of Labor With Vaginal Delivery      27 y.o. yo G1P1001 at [redacted]w[redacted]d was admitted in Latent Labor on 02/03/2023. Labor course was complicated by dx of gHTN due to mild range BP elevations in MAU; asymptomatic; neg labs.  Membrane Rupture Time/Date: 2:01 PM ,02/03/2023   Delivery Method:Vaginal, Spontaneous  Episiotomy: None  Lacerations:  1st degree  Patient had an uncomplicated postpartum course. She was given Lasix 20mg  x 5d and did not require antihypertensives. She is ambulating, tolerating a regular diet, passing flatus, and urinating well. Patient is discharged home in stable condition on 02/04/23.  Newborn Data: Birth date:02/03/2023  Birth time:4:27 PM  Gender:Female  Living status:Living  Apgars:9 ,9  Weight:2870 g (6lb 5.2oz)  Magnesium Sulfate received: No BMZ received: No Rhophylac:N/A MMR:N/A T-DaP:Given prenatally Flu: No Transfusion:No  Physical exam  Vitals:   02/04/23 0004 02/04/23 0423 02/04/23 0800 02/04/23 1300  BP: 116/66 118/63 108/68 116/76  Pulse: (!) 102 99 67 97  Resp: 16 16    Temp: 98.9 F (37.2 C) 98.8 F (37.1 C) 98 F (36.7 C) 98.6 F (37 C)  TempSrc: Oral Oral Oral Oral  SpO2:   99% 100%   Weight:      Height:       General: alert and cooperative Lochia: appropriate Uterine Fundus: firm Incision: N/A DVT Evaluation: No evidence of DVT seen on physical exam. Labs: Lab Results  Component Value Date   WBC 9.0 02/03/2023   HGB 13.3 02/03/2023   HCT 40.1 02/03/2023   MCV 86.2 02/03/2023   PLT 193 02/03/2023      Latest Ref Rng & Units 02/03/2023    5:44 AM  CMP  Glucose 70 - 99 mg/dL 409   BUN 6 - 20 mg/dL 8   Creatinine 8.11 - 9.14 mg/dL 7.82   Sodium 956 - 213 mmol/L 132   Potassium 3.5 - 5.1 mmol/L 3.9   Chloride 98 - 111 mmol/L 99   CO2 22 - 32 mmol/L 20   Calcium 8.9 - 10.3 mg/dL 9.6   Total Protein 6.5 - 8.1 g/dL 7.1   Total Bilirubin 0.3 - 1.2 mg/dL 0.4   Alkaline Phos 38 - 126 U/L 212   AST 15 - 41 U/L 20   ALT 0 - 44 U/L 18    Edinburgh Score:    02/03/2023    7:21 PM  Edinburgh Postnatal Depression Scale Screening Tool  I have been able to laugh and see the funny side of things. 0  I have looked forward with enjoyment to things. 0  I have  blamed myself unnecessarily when things went wrong. 0  I have been anxious or worried for no good reason. 0  I have felt scared or panicky for no good reason. 0  Things have been getting on top of me. 0  I have been so unhappy that I have had difficulty sleeping. 0  I have felt sad or miserable. 0  I have been so unhappy that I have been crying. 0  The thought of harming myself has occurred to me. 0  Edinburgh Postnatal Depression Scale Total 0     After visit meds:  Allergies as of 02/04/2023       Reactions   Kiwi Extract      Med Rec must be completed prior to using this [facility]***        Discharge home in stable condition Infant Feeding: {Baby feeding:23562} Infant Disposition:{CHL IP OB HOME WITH UJWJXB:14782} Discharge instruction: per After Visit Summary and Postpartum booklet. Activity: Advance as tolerated. Pelvic rest for 6 weeks.  Diet: routine diet Future  Appointments: Future Appointments  Date Time Provider Department Center  02/11/2023 10:10 AM [facility] NURSE [facility]  03/10/2023  1:30 PM Cheral Marker, CNM [facility]   Follow up Visit:  Arabella Merles, CNM  Myrle Sheng R Please schedule this patient for Postpartum visit in: 4 weeks with the following provider: Any provider In-Person For C/S patients schedule nurse incision check in weeks 2 weeks: no High risk pregnancy complicated by: gHTN Delivery mode:  SVD Anticipated Birth Control:  POPs PP Procedures needed: BP check- RN visit 1wk Schedule Integrated BH visit: no   02/04/2023 Arabella Merles, CNM

## 2023-02-04 LAB — BIRTH TISSUE RECOVERY COLLECTION (PLACENTA DONATION)

## 2023-02-04 NOTE — Lactation Note (Signed)
This note was copied from a baby's chart. Lactation Consultation Note  Patient Name: Lauren Guzman [id number] Date: 02/04/2023 Age:27 hours  Reason for consult: Follow-up assessment;1st time breastfeeding;Primapara;Term  P1, GA [redacted]w[redacted]d, 2% weight loss  Mother receptive to [facility] visit. She reports baby has been breastfeeding well and denies any questions or concerns. Baby has exclusively breast fed. He has recently returned from his circumcision and sleeping.   Discussed anticipated frequent feeding that will occur with cluster feeding that will most likely occur tonight and discussed it's positive role in trigger increased milk production.. Instructed to call for help as needed, feed baby with cues, 8-12 times/ 24 hours and skin to skin when alert and awake to promote milk supply.  Mother denies having any clogged ducts in her breast at this time. Mother was having clogged ducts prior to delivery. Instructed to inform care providers if occurs prior to discharge.     Feeding Mother's Current Feeding Choice: Breast Milk   Interventions Interventions: Education  Consult Status Consult Status: Follow-up Date: 02/04/23 Follow-up type: In-patient   [name] M 02/04/2023, 6:00 PM

## 2023-02-04 NOTE — Social Work (Signed)
CSW received consult for hx of Anxiety and Depression.  CSW met with MOB to offer support and complete assessment. CSW entered the room and observed MOB sitting on the bed with the infant. CSW introduced self, CSW role and reason for visit, MOB was agreeable to visit. CSW inquired about how MOB was feeling MOB reported good. CSW inquired about MOB MH hx, MOB reported she was diagnosed with anxiety at the beginning of 2023.. MOB reported she was initially prescribed hydroxyzine and then Prozac but stopped taking the Prozac in May 2023. MOB reported she has not had anxiety attacks and reports a stable mood since being off of the medication. CSW inquired about MOB coping skills, MOB reported she writes. CSW assessed for safety, MOB denied any SI, HI or DV. CSW provided education regarding the baby blues period vs. perinatal mood disorders, discussed treatment and gave resources for mental health follow up if concerns arise.  CSW recommends self-evaluation during the postpartum time period using the New Mom Checklist from Postpartum Progress and encouraged MOB to contact a medical professional if symptoms are noted at any time. MOB identified her parents as her supports.  CSW provided review of Sudden Infant Death Syndrome (SIDS) precautions. MOB reported she had all necessary items for the infant, including a bassinet and car seat. CSW identifies no further need for intervention and no barriers to discharge at this time.  Elzia Hott, LCSWA Clinical Social Worker [phone] 

## 2023-02-04 NOTE — Progress Notes (Signed)
POSTPARTUM PROGRESS NOTE  Post Partum Day 1  Subjective:  Lauren Guzman is a 27 y.o. G1P1001 s/p VD at [redacted]w[redacted]d.  She reports she is doing well. No acute events overnight. She denies any problems with ambulating, voiding or po intake. Denies nausea or vomiting.  Pain is well controlled.  Lochia is minimal.  Objective: Blood pressure 118/63, pulse 99, temperature 98.8 F (37.1 C), temperature source Oral, resp. rate 16, height 5\' 2"  (1.575 m), weight 108.2 kg, last menstrual period 05/01/2022, SpO2 100 %, unknown if currently breastfeeding.  Physical Exam:  General: alert, cooperative and no distress Chest: no respiratory distress Heart:regular rate, distal pulses intact Abdomen: soft, nontender,  Uterine Fundus: firm, appropriately tender DVT Evaluation: No calf swelling or tenderness Extremities: no edema Skin: warm, dry  Recent Labs    02/03/23 0544  HGB 13.3  HCT 40.1    Assessment/Plan: Lauren Guzman is a 27 y.o. G1P1001 s/p VD at [redacted]w[redacted]d   PPD#1 - Doing well  Routine postpartum care gHTN: blood pressures well controlled post partum Continue lasix 20mg  daily.  Contraception: POPs Feeding: breast Dispo: Plan for discharge tomorrow.   LOS: 1 day   Sheppard Evens MD MPH OB Fellow, Faculty Practice [facility], [facility] for [facility] 02/04/2023

## 2023-02-04 NOTE — Anesthesia Postprocedure Evaluation (Signed)
Anesthesia Post Note  Patient: [name]  Procedure(s) Performed: AN AD HOC LABOR EPIDURAL     Patient location during evaluation: Mother Baby Anesthesia Type: Epidural Level of consciousness: awake and alert Pain management: pain level controlled Vital Signs Assessment: post-procedure vital signs reviewed and stable Respiratory status: spontaneous breathing, nonlabored ventilation and respiratory function stable Cardiovascular status: stable Postop Assessment: no headache, no backache and epidural receding Anesthetic complications: no   No notable events documented.  Last Vitals:  Vitals:   02/04/23 0004 02/04/23 0423  BP: 116/66 118/63  Pulse: (!) 102 99  Resp: 16 16  Temp: 37.2 C 37.1 C  SpO2:      Last Pain:  Vitals:   02/04/23 0503  TempSrc:   PainSc: 0-No pain   Pain Goal: Patients Stated Pain Goal: 0 (02/03/23 0454)                 Fanny Dance

## 2023-02-05 ENCOUNTER — Other Ambulatory Visit (HOSPITAL_COMMUNITY): Payer: Self-pay

## 2023-02-05 ENCOUNTER — Encounter (HOSPITAL_COMMUNITY): Payer: Self-pay | Admitting: Obstetrics & Gynecology

## 2023-02-05 MED ORDER — IBUPROFEN 600 MG PO TABS
600.0000 mg | ORAL_TABLET | Freq: Four times a day (QID) | ORAL | 0 refills | Status: DC | PRN
  Filled 2023-02-05: qty 30, 8d supply, fill #0

## 2023-02-05 MED ORDER — FUROSEMIDE 20 MG PO TABS
20.0000 mg | ORAL_TABLET | Freq: Every day | ORAL | 0 refills | Status: DC
  Filled 2023-02-05: qty 2, 2d supply, fill #0

## 2023-02-05 NOTE — Lactation Note (Signed)
This note was copied from a baby's chart. Lactation Consultation Note  Patient Name: Lauren Guzman [id number] Date: 02/05/2023 Age:27 Reason for consult: Follow-up assessment;Primapara;1st time breastfeeding;Infant weight loss;Breastfeeding assistance;Term (6.27% WL)  The infant was at 73 hours old.  LC entered the room and the birth parent was holding the infant.  Per the birth parent everything is going well with breastfeeding.  LC educated the parents on:  Engorgement Mastitis Warning signs Infant I/O Breast care The importance of maternal nutrition, hydration, and rest.  Milk production.  All questions were answered.   Infant Feeding Plan:  Breastfeed 8+ times in 24 hours according to feeding cues.  Watch infant output and call the pediatrician with concerns. Call the outpatient lactation consultant with concerns about breastfeeding.  Prioritize maternal rest, nutrition, and hydration.  Use ice instead of heat for engorgement (see the handout).   Feeding Mother's Current Feeding Choice: Breast Milk  Interventions Interventions: Education  Discharge Discharge Education: Engorgement and breast care;Warning signs for feeding baby;Outpatient recommendation  Consult Status Consult Status: Complete Date: 02/05/23 Follow-up type: Call as needed   Delene Loll 02/05/2023, 12:45 PM

## 2023-02-06 ENCOUNTER — Other Ambulatory Visit: Payer: Medicaid Other

## 2023-02-06 ENCOUNTER — Encounter: Payer: Medicaid Other | Admitting: Obstetrics & Gynecology

## 2023-02-11 ENCOUNTER — Ambulatory Visit (INDEPENDENT_AMBULATORY_CARE_PROVIDER_SITE_OTHER): Payer: Medicaid Other | Admitting: *Deleted

## 2023-02-11 VITALS — BP 130/80 | HR 84

## 2023-02-11 DIAGNOSIS — Z0130 Encounter for examination of blood pressure without abnormal findings: Secondary | ICD-10-CM

## 2023-02-11 NOTE — Progress Notes (Signed)
   NURSE VISIT- BLOOD PRESSURE CHECK  SUBJECTIVE:  Lauren Guzman is a 27 y.o. G73P1001 female here for BP check. She is postpartum, delivery date 02/03/23     HYPERTENSION ROS:  Pregnant/postpartum:  Severe headaches that don't go away with tylenol/other medicines: No  Visual changes (seeing spots/double/blurred vision) No  Severe pain under right breast breast or in center of upper chest No  Severe nausea/vomiting No  Taking medicines as instructed not applicable    OBJECTIVE:  BP 130/80 (BP Location: Right Arm, Patient Position: Sitting, Cuff Size: Large)   Pulse 84   LMP 05/01/2022   Breastfeeding Yes   Appearance alert, well appearing, and in no distress.  ASSESSMENT: Postpartum  blood pressure check  PLAN: Discussed with Dr. Charlotta Newton   Recommendations: no changes needed   Follow-up: as scheduled   Annamarie Dawley  02/11/2023 10:22 AM

## 2023-03-10 ENCOUNTER — Ambulatory Visit: Payer: Medicaid Other | Admitting: Women's Health

## 2023-03-19 ENCOUNTER — Ambulatory Visit (INDEPENDENT_AMBULATORY_CARE_PROVIDER_SITE_OTHER): Payer: Medicaid Other | Admitting: Advanced Practice Midwife

## 2023-03-19 MED ORDER — CAMILA 0.35 MG PO TABS: 1.0000 | ORAL_TABLET | Freq: Every day | ORAL | 11 refills | Status: DC

## 2023-03-19 NOTE — Progress Notes (Signed)
Post Partum Visit Note   Chief Complaint:   Postpartum Care and Contraception (Would like to try oral BC)  History of Present Illness:   Lauren Guzman is a 27 y.o. G68P1001 African American female being seen today for a postpartum visit. She is 5 weeks postpartum following a spontaneous vaginal delivery at 39.5 gestational weeks. IOL: Yes, for Gestational hypertension. Anesthesia: epidural.  Laceration: 1st degree.  Complications: none. Inpatient contraception: no.   Pregnancy complicated by [facility] on day of admission . Did not require meds Tobacco use: no. Substance use disorder: no. Last pap smear:   07/18/21 NILM  Next pap smear due: 2025 Patient's last menstrual period was 05/01/2022.  Postpartum course has been uncomplicated. Bleeding no bleeding. Bowel function is normal. Bladder function is normal. Urinary incontinence? No, fecal incontinence? No Patient is not sexually active. Last sexual activity: prior to birth.   The pregnancy intention screening data noted above was reviewed. Potential methods of contraception were discussed. The patient elected to proceed with No data recorded.  Edinburgh Postpartum Depression Screening: Negative  Edinburgh Postnatal Depression Scale - 03/19/23 1333       Edinburgh Postnatal Depression Scale:  In the Past 7 Days   I have been able to laugh and see the funny side of things. 0    I have looked forward with enjoyment to things. 0    I have blamed myself unnecessarily when things went wrong. 0    I have been anxious or worried for no good reason. 0    I have felt scared or panicky for no good reason. 0    Things have been getting on top of me. 0    I have been so unhappy that I have had difficulty sleeping. 0    I have felt sad or miserable. 0    I have been so unhappy that I have been crying. 0    The thought of harming myself has occurred to me. 0    Edinburgh Postnatal Depression Scale Total 0            Baby's course has been  uncomplicated. Baby is feeding by breast: milk supply adequate. Infant has a pediatrician/family doctor? Yes.  Childcare strategy if returning to work/school: yes.  Pt has material needs met for her and baby: Yes.   Review of Systems:   Pertinent items are noted in HPI Denies Abnormal vaginal discharge w/ itching/odor/irritation, headaches, visual changes, shortness of breath, chest pain, abdominal pain, severe nausea/vomiting, or problems with urination or bowel movements. Pertinent History Reviewed:  Reviewed past medical,surgical, obstetrical and family history.  Reviewed problem list, medications and allergies. OB History  Gravida Para Term Preterm AB Living  1 1 1     1   SAB IAB Ectopic Multiple Live Births        0 1    # Outcome Date GA Lbr Len/2nd Weight Sex Type Anes PTL Lv  1 Term 02/03/23 [redacted]w[redacted]d 12:40 / 00:47 6 lb 5.2 oz (2.87 kg) M Vag-Spont EPI  LIV   Physical Assessment:   Vitals:   03/19/23 1337  BP: 124/81  Weight: 213 lb (96.6 kg)  Height: 5\' 2"  (1.575 m)  Body mass index is 38.96 kg/m.  Objective:  Blood pressure 124/81, height 5\' 2"  (1.575 m), weight 213 lb (96.6 kg), last menstrual period 05/01/2022, currently breastfeeding.  General:  alert, cooperative, and no distress   Breasts:  negative  Lungs: Normal respiratory effort  Heart:  regular  rate and rhythm  Abdomen: No evaluated, no C/O   Vulva:  No evaluated, no C/O  Vagina: No evaluated, no C/O  Cervix:  normal  Corpus: Well involuted  Adnexa:  not evaluated  Rectal Exam: no hemorrhoids          No results found for this or any previous visit (from the past 24 hour(s)).  Assessment & Plan:  1) Postpartum exam 2) 5 wks s/p spontaneous vaginal delivery 3) breast feeding 4) Depression screening 5) Contraception management: Start POPs today, BU for 2 weeks  Essential components of care per ACOG recommendations:  1.  Mood and well being:  If positive depression screen, discussed and plan  developed.  If using tobacco we discussed reduction/cessation and risk of relapse If current substance abuse, we discussed and referral to local resources was offered.   2. Infant care and feeding:  If breastfeeding, discussed returning to work, pumping, breastfeeding-associated pain, guidance regarding return to fertility while lactating if not using another method. If needed, patient was provided with a letter to be allowed to pump q 2-3hrs to support lactation in a private location with access to a refrigerator to store breastmilk.   Recommended that all caregivers be immunized for flu, pertussis and other preventable communicable diseases If pt does not have material needs met for her/baby, referred to local resources for help obtaining these.  3. Sexuality, contraception and birth spacing Provided guidance regarding sexuality, management of dyspareunia, and resumption of intercourse Discussed avoiding interpregnancy interval <81mths and recommended birth spacing of 18 months  4. Sleep and fatigue Discussed coping options for fatigue and sleep disruption Encouraged family/partner/community support of 4 hrs of uninterrupted sleep to help with mood and fatigue  5. Physical recovery  If pt had a C/S, assessed incisional pain and providing guidance on normal vs prolonged recovery If pt had a laceration, perineal healing and pain reviewed.  If urinary or fecal incontinence, discussed management and referred to PT or uro/gyn if indicated  Patient is safe to resume physical activity. Discussed attainment of healthy weight.  6.  Chronic disease management Discussed pregnancy complications if any, and their implications for future childbearing and long-term maternal health. Review recommendations for prevention of recurrent pregnancy complications, such as aspirin to reduce risk of preeclampsia yes. Pt had GDM: No. If yes, 2hr GTT scheduled: not applicable. Reviewed medications and non-pregnant  dosing including consideration of whether pt is breastfeeding using a reliable resource such as LactMed: not applicable Referred for f/u w/ PCP or subspecialist providers as indicated: not applicable  7. Health maintenance Mammogram at 27yo or earlier if indicated Pap smears as indicated  Meds: No orders of the defined types were placed in this encounter.   Follow-up: No follow-ups on file.   No orders of the defined types were placed in this encounter.      Jacklyn Shell DNP, CNM [facility] [employer], [facility] 03/19/2023 1:41 PM

## 2023-05-06 ENCOUNTER — Telehealth: Payer: Medicaid Other | Admitting: Family Medicine

## 2023-05-06 DIAGNOSIS — U071 COVID-19: Secondary | ICD-10-CM

## 2023-05-06 NOTE — Patient Instructions (Addendum)
Lauren Guzman, thank you for joining Freddy Finner, NP for today's virtual visit.  While this provider is not your primary care provider (PCP), if your PCP is located in our provider database this encounter information will be shared with them immediately following your visit.   A [facility] MyChart account gives you access to today's visit and all your visits, tests, and labs performed at [facility] " click here if you don't have a [facility] MyChart account or go to [email or web address]  Consent: (Patient) Lauren Guzman provided verbal consent for this virtual visit at the beginning of the encounter.  Current Medications:  Current Outpatient Medications:    furosemide (LASIX) 20 MG tablet, Take 1 tablet (20 mg total) by mouth daily. (Patient not taking: Reported on 02/11/2023), Disp: 2 tablet, Rfl: 0   ibuprofen (ADVIL) 600 MG tablet, Take 1 tablet (600 mg total) by mouth every 6 (six) hours as needed., Disp: 30 tablet, Rfl: 0   norethindrone (CAMILA) 0.35 MG tablet, Take 1 tablet (0.35 mg total) by mouth daily., Disp: 28 tablet, Rfl: 11   Prenatal Vit-Fe Fumarate-FA (PRENATAL VITAMIN PO), Take by mouth., Disp: , Rfl:    Medications ordered in this encounter:  No orders of the defined types were placed in this encounter.    *If you need refills on other medications prior to your next appointment, please contact your pharmacy*  Follow-Up: Call back or seek an in-person evaluation if the symptoms worsen or if the condition fails to improve as anticipated.  [facility] [phone]  Care Instructions:   -OTC safe options reviewed for breastfeeding including Claritin (discussed risk of drying or reducing milk supply) flonase (reported compatible- but limited studies)  -dicussed to continue breastfeeding- watch for changes in son, from medications and or COVID (she has spoken to his peds dr)  -wear a mask around son to help prevent spreading   Follow up  in person if not improving Hydrate and rest  Isolation Instructions: You are to isolate at home until you have been fever free for at least 24 hours without a fever-reducing medication, and symptoms have been steadily improving for 24 hours. At that time,  you can end isolation but need to mask for an additional 5 days.   If you must be around other household members who do not have symptoms, you need to make sure that both you and the family members are masking consistently with a high-quality mask.  If you note any worsening of symptoms despite treatment, please seek an in-person evaluation ASAP. If you note any significant shortness of breath or any chest pain, please seek ER evaluation. Please do not delay care!   COVID-19: What to Do if You Are Sick If you test positive and are an older adult or someone who is at high risk of getting very sick from COVID-19, treatment may be available. Contact a healthcare provider right away after a positive test to determine if you are eligible, even if your symptoms are mild right now. You can also visit a Test to Treat location and, if eligible, receive a prescription from a provider. Don't delay: Treatment must be started within the first few days to be effective. If you have a fever, cough, or other symptoms, you might have COVID-19. Most people have mild illness and are able to recover at home. If you are sick: Keep track of your symptoms. If you have an emergency warning sign (including trouble breathing), call  911. Steps to help prevent the spread of COVID-19 if you are sick If you are sick with COVID-19 or think you might have COVID-19, follow the steps below to care for yourself and to help protect other people in your home and community. Stay home except to get medical care Stay home. Most people with COVID-19 have mild illness and can recover at home without medical care. Do not leave your home, except to get medical care. Do not visit public  areas and do not go to places where you are unable to wear a mask. Take care of yourself. Get rest and stay hydrated. Take over-the-counter medicines, such as acetaminophen, to help you feel better. Stay in touch with your doctor. Call before you get medical care. Be sure to get care if you have trouble breathing, or have any other emergency warning signs, or if you think it is an emergency. Avoid public transportation, ride-sharing, or taxis if possible. Get tested If you have symptoms of COVID-19, get tested. While waiting for test results, stay away from others, including staying apart from those living in your household. Get tested as soon as possible after your symptoms start. Treatments may be available for people with COVID-19 who are at risk for becoming very sick. Don't delay: Treatment must be started early to be effective--some treatments must begin within 5 days of your first symptoms. Contact your healthcare provider right away if your test result is positive to determine if you are eligible. Self-tests are one of several options for testing for the virus that causes COVID-19 and may be more convenient than laboratory-based tests and point-of-care tests. Ask your healthcare provider or your local health department if you need help interpreting your test results. You can visit your state, tribal, local, and territorial health department's website to look for the latest local information on testing sites. Separate yourself from other people As much as possible, stay in a specific room and away from other people and pets in your home. If possible, you should use a separate bathroom. If you need to be around other people or animals in or outside of the home, wear a well-fitting mask. Tell your close contacts that they may have been exposed to COVID-19. An infected person can spread COVID-19 starting 48 hours (or 2 days) before the person has any symptoms or tests positive. By letting your close  contacts know they may have been exposed to COVID-19, you are helping to protect everyone. See COVID-19 and Animals if you have questions about pets. If you are diagnosed with COVID-19, someone from the health department may call you. Answer the call to slow the spread. Monitor your symptoms Symptoms of COVID-19 include fever, cough, or other symptoms. Follow care instructions from your healthcare provider and local health department. Your local health authorities may give instructions on checking your symptoms and reporting information. When to seek emergency medical attention Look for emergency warning signs* for COVID-19. If someone is showing any of these signs, seek emergency medical care immediately: Trouble breathing Persistent pain or pressure in the chest New confusion Inability to wake or stay awake Pale, gray, or blue-colored skin, lips, or nail beds, depending on skin tone *This list is not all possible symptoms. Please call your medical provider for any other symptoms that are severe or concerning to you. Call 911 or call ahead to your local emergency facility: Notify the operator that you are seeking care for someone who has or may have COVID-19. Call ahead before  visiting your doctor Call ahead. Many medical visits for routine care are being postponed or done by phone or telemedicine. If you have a medical appointment that cannot be postponed, call your doctor's office, and tell them you have or may have COVID-19. This will help the office protect themselves and other patients. If you are sick, wear a well-fitting mask You should wear a mask if you must be around other people or animals, including pets (even at home). Wear a mask with the best fit, protection, and comfort for you. You don't need to wear the mask if you are alone. If you can't put on a mask (because of trouble breathing, for example), cover your coughs and sneezes in some other way. Try to stay at least 6 feet away  from other people. This will help protect the people around you. Masks should not be placed on young children under age 78 years, anyone who has trouble breathing, or anyone who is not able to remove the mask without help. Cover your coughs and sneezes Cover your mouth and nose with a tissue when you cough or sneeze. Throw away used tissues in a lined trash can. Immediately wash your hands with soap and water for at least 20 seconds. If soap and water are not available, clean your hands with an alcohol-based hand sanitizer that contains at least 60% alcohol. Clean your hands often Wash your hands often with soap and water for at least 20 seconds. This is especially important after blowing your nose, coughing, or sneezing; going to the bathroom; and before eating or preparing food. Use hand sanitizer if soap and water are not available. Use an alcohol-based hand sanitizer with at least 60% alcohol, covering all surfaces of your hands and rubbing them together until they feel dry. Soap and water are the best option, especially if hands are visibly dirty. Avoid touching your eyes, nose, and mouth with unwashed hands. Handwashing Tips Avoid sharing personal household items Do not share dishes, drinking glasses, cups, eating utensils, towels, or bedding with other people in your home. Wash these items thoroughly after using them with soap and water or put in the dishwasher. Clean surfaces in your home regularly Clean and disinfect high-touch surfaces (for example, doorknobs, tables, handles, light switches, and countertops) in your "sick room" and bathroom. In shared spaces, you should clean and disinfect surfaces and items after each use by the person who is ill. If you are sick and cannot clean, a caregiver or other person should only clean and disinfect the area around you (such as your bedroom and bathroom) on an as needed basis. Your caregiver/other person should wait as long as possible (at least  several hours) and wear a mask before entering, cleaning, and disinfecting shared spaces that you use. Clean and disinfect areas that may have blood, stool, or body fluids on them. Use household cleaners and disinfectants. Clean visible dirty surfaces with household cleaners containing soap or detergent. Then, use a household disinfectant. Use a product from [employer] List N: Disinfectants for Coronavirus (COVID-19). Be sure to follow the instructions on the label to ensure safe and effective use of the product. Many products recommend keeping the surface wet with a disinfectant for a certain period of time (look at "contact time" on the product label). You may also need to wear personal protective equipment, such as gloves, depending on the directions on the product label. Immediately after disinfecting, wash your hands with soap and water for 20 seconds. For completed  guidance on cleaning and disinfecting your home, visit Complete Disinfection Guidance. Take steps to improve ventilation at home Improve ventilation (air flow) at home to help prevent from spreading COVID-19 to other people in your household. Clear out COVID-19 virus particles in the air by opening windows, using air filters, and turning on fans in your home. Use this interactive tool to learn how to improve air flow in your home. When you can be around others after being sick with COVID-19 Deciding when you can be around others is different for different situations. Find out when you can safely end home isolation. For any additional questions about your care, contact your healthcare provider or state or local health department. 11/27/2020 Content source: [facility] ([facility]), Division of Viral Diseases This information is not intended to replace advice given to you by your health care provider. Make sure you discuss any questions you have with your health care provider. Document Revised:  01/10/2021 Document Reviewed: 01/10/2021 Elsevier Patient Education  2022 [employer].     If you have been instructed to have an in-person evaluation today at a local Urgent Care facility, please use the link below. It will take you to a list of all of our available [facility], including address, phone number and hours of operation. Please do not delay care.  [facility]  If you or a family member do not have a primary care provider, use the link below to schedule a visit and establish care. When you choose a [facility] primary care physician or advanced practice provider, you gain a long-term partner in health. Find a Primary Care Provider  Learn more about [facility]'s in-office and virtual care options: [place] - Get Care Now

## 2023-05-06 NOTE — Progress Notes (Signed)
Virtual Visit Consent   [name], you are scheduled for a virtual visit with a [facility] provider today. Just as with appointments in the office, your consent must be obtained to participate. Your consent will be active for this visit and any virtual visit you may have with one of our providers in the next 365 days. If you have a MyChart account, a copy of this consent can be sent to you electronically.  As this is a virtual visit, video technology does not allow for your provider to perform a traditional examination. This may limit your provider's ability to fully assess your condition. If your provider identifies any concerns that need to be evaluated in person or the need to arrange testing (such as labs, EKG, etc.), we will make arrangements to do so. Although advances in technology are sophisticated, we cannot ensure that it will always work on either your end or our end. If the connection with a video visit is poor, the visit may have to be switched to a telephone visit. With either a video or telephone visit, we are not always able to ensure that we have a secure connection.  By engaging in this virtual visit, you consent to the provision of healthcare and authorize for your insurance to be billed (if applicable) for the services provided during this visit. Depending on your insurance coverage, you may receive a charge related to this service.  I need to obtain your verbal consent now. Are you willing to proceed with your visit today? Lauren Guzman has provided verbal consent on 05/06/2023 for a virtual visit (video or telephone). Freddy Finner, NP  Date: 05/06/2023 11:03 AM  Virtual Visit via Video Note   I, Freddy Finner, connected with  Lauren Guzman  ([id number], 07/19/1996) on 05/06/23 at 11:00 AM EDT by a video-enabled telemedicine application and verified that I am speaking with the correct person using two identifiers.  Location: Patient: Virtual Visit Location Patient:  Home Provider: Virtual Visit Location Provider: Home Office   I discussed the limitations of evaluation and management by telemedicine and the availability of in person appointments. The patient expressed understanding and agreed to proceed.    History of Present Illness: Lauren Guzman is a 27 y.o. 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:  this morning  Vaccines: yes, but no booster   Problems:  Patient Active Problem List   Diagnosis Date Noted   Normal labor 02/03/2023   Gestational hypertension 02/03/2023   GBS (group B Streptococcus carrier), +RV culture, currently pregnant 01/12/2023   Breast mass 12/23/2022   Abnormal chromosomal and genetic finding on antenatal screening mother 08/12/2022   Supervision of normal first pregnancy 07/29/2022   Chronic migraine 05/18/2017    Allergies:  Allergies  Allergen Reactions   Kiwi Extract    Medications:  Current Outpatient Medications:    furosemide (LASIX) 20 MG tablet, Take 1 tablet (20 mg total) by mouth daily. (Patient not taking: Reported on 02/11/2023), Disp: 2 tablet, Rfl: 0   ibuprofen (ADVIL) 600 MG tablet, Take 1 tablet (600 mg total) by mouth every 6 (six) hours as needed., Disp:  30 tablet, Rfl: 0   norethindrone (CAMILA) 0.35 MG tablet, Take 1 tablet (0.35 mg total) by mouth daily., Disp: 28 tablet, Rfl: 11   Prenatal Vit-Fe Fumarate-FA (PRENATAL VITAMIN PO), Take by mouth., Disp: , Rfl:   Observations/Objective: Patient is well-developed, well-nourished in no acute distress.  Resting comfortably  at home.  Head is normocephalic, atraumatic.  No labored  breathing.  Speech is clear and coherent with logical content.  Patient is alert and oriented at baseline.    Assessment and Plan:  1. COVID-19  -OTC safe options reviewed for breastfeeding including Claritin (discussed risk of drying or reducing milk supply) flonase (UTD reports compatible- but limited studies)  -dicussed to continue breastfeeding- watch for changes in son, from medications and or COVID (she has spoken to his peds dr)  -wear a mask around son to help prevent spreading   - Continue OTC symptomatic management of choice - Info for COVID sent on AVS as well - Take prescribed medications as directed - Push fluids - Rest as needed - Discussed return precautions and when to seek in-person evaluation,   Reviewed side effects, risks and benefits of medication.    Patient acknowledged agreement and understanding of the plan.   Past Medical, Surgical, Social History, Allergies, and Medications have been Reviewed.     Follow Up Instructions: I discussed the assessment and treatment plan with the patient. The patient was provided an opportunity to ask questions and all were answered. The patient agreed with the plan and demonstrated an understanding of the instructions.  A copy of instructions were sent to the patient via MyChart unless otherwise noted below.    The patient was advised to call back or seek an in-person evaluation if the symptoms worsen or if the condition fails to improve as anticipated.  Time:  I spent 12 minutes with the patient via telehealth technology discussing the above problems/concerns.    Freddy Finner, NP

## 2023-09-09 NOTE — L&D Delivery Note (Cosign Needed Addendum)
 OB/GYN Delivery Note  Lauren Guzman is a 28 y.o. G2P1001 s/p NSVD at [redacted]w[redacted]d. She was admitted for IOL for BMI>40.   ROM: 4h 2m with clear fluid GBS Status: positive Maximum Maternal Temperature: 98.9  Labor Progress: 4cm dilated on arrival, progressed quickly to 10cm Augmented with pitocin   Delivery Date/Time: 1444 Delivery: Called to room and patient was complete and pushing. Head delivered right occiput anterior. nuchal cord absent. Shoulder and body delivered in usual fashion. Infant with spontaneous cry, placed on mother's abdomen, dried and stimulated. Cord clamped x 2 after 1-minute delay, and cut by father. Placenta delivered spontaneously with gentle cord traction. Fundus firm with massage and Pitocin . Labia, perineum, vagina, and cervix inspected inspected and a superficial right periurethral and first degree perineal tear were repaired with 3-0 vicryl until hemostasis was achieved.   Placenta:  spontaneous Intact Complications:none Lacerations: 1st degree and periurethral EBL: Analgesia: Epidural  Newborn Data: Birth date:07/31/2024 Birth time:2:44 PM Gender:Female Living status:Living Apgars:8 ,  Weight:            Lauren JONELLE Carpen, MD Family Medicine PGY-1 07/31/2024, 3:13 PM  Attestation of Supervision of Student:  I confirm that I have verified the information documented in the resident's note and that I have also personally directly supervised the history, physical exam and all medical decision making activities.  I have verified that all services and findings are accurately documented in this student's note; and I agree with management and plan as outlined in the documentation. I have also made any necessary editorial changes.   Lauren LITTIE Angles, MD OB Fellow 07/31/2024 3:46 PM

## 2023-12-08 ENCOUNTER — Encounter: Payer: Self-pay | Admitting: *Deleted

## 2023-12-08 ENCOUNTER — Ambulatory Visit

## 2023-12-08 VITALS — BP 120/74 | HR 109 | Ht 61.0 in | Wt 236.0 lb

## 2023-12-08 DIAGNOSIS — Z3201 Encounter for pregnancy test, result positive: Secondary | ICD-10-CM

## 2023-12-08 LAB — POCT URINE PREGNANCY: Preg Test, Ur: POSITIVE — AB

## 2023-12-08 NOTE — Progress Notes (Signed)
   NURSE VISIT- PREGNANCY CONFIRMATION   SUBJECTIVE:  Lauren Guzman is a 28 y.o. G76P1001 female at Unknown by uncertain LMP of No LMP recorded (lmp unknown). Patient is pregnant. Here for pregnancy confirmation.  Home pregnancy test: positive x 1.   She reports no complaints.  She is not taking prenatal vitamins.    OBJECTIVE:  BP 120/74 (BP Location: Left Arm, Patient Position: Sitting, Cuff Size: Large)   Pulse (!) 109   Ht 5\' 1"  (1.549 m)   Wt 236 lb (107 kg)   LMP  (LMP Unknown)   Breastfeeding Yes   BMI 44.59 kg/m  1st BP reading was 136/92 pulse 116.  Appears well, in no apparent distress  Results for orders placed or performed in visit on 12/08/23 (from the past 24 hours)  POCT urine pregnancy   Collection Time: 12/08/23  2:53 PM  Result Value Ref Range   Preg Test, Ur Positive (A) Negative    ASSESSMENT: Positive pregnancy test, Unknown by LMP. Quant ordered.    PLAN: Schedule for dating ultrasound quant pending Prenatal vitamins: plans to begin OTC ASAP   Nausea medicines: not currently needed   OB packet given: Yes  Malachy Mood  12/08/2023 3:03 PM

## 2023-12-09 LAB — BETA HCG QUANT (REF LAB): hCG Quant: 5882 m[IU]/mL

## 2023-12-23 ENCOUNTER — Other Ambulatory Visit: Payer: Self-pay | Admitting: Obstetrics & Gynecology

## 2023-12-23 DIAGNOSIS — O3680X0 Pregnancy with inconclusive fetal viability, not applicable or unspecified: Secondary | ICD-10-CM

## 2023-12-24 ENCOUNTER — Ambulatory Visit

## 2023-12-24 DIAGNOSIS — O3680X0 Pregnancy with inconclusive fetal viability, not applicable or unspecified: Secondary | ICD-10-CM

## 2023-12-24 DIAGNOSIS — Z3A01 Less than 8 weeks gestation of pregnancy: Secondary | ICD-10-CM

## 2023-12-24 DIAGNOSIS — Z3491 Encounter for supervision of normal pregnancy, unspecified, first trimester: Secondary | ICD-10-CM | POA: Diagnosis not present

## 2023-12-24 NOTE — Progress Notes (Signed)
 US  7+4 wks,single IUP with yolk sac,CRL 12.67 mm,normal ovaries,FHR 154 bpm

## 2024-01-25 ENCOUNTER — Other Ambulatory Visit: Payer: Self-pay | Admitting: Obstetrics & Gynecology

## 2024-01-25 DIAGNOSIS — Z3682 Encounter for antenatal screening for nuchal translucency: Secondary | ICD-10-CM

## 2024-01-26 ENCOUNTER — Other Ambulatory Visit (HOSPITAL_COMMUNITY)
Admission: RE | Admit: 2024-01-26 | Discharge: 2024-01-26 | Disposition: A | Discharge status: Home / Self Care | Source: Ambulatory Visit | Attending: Women's Health | Admitting: Women's Health

## 2024-01-26 ENCOUNTER — Encounter: Admitting: *Deleted

## 2024-01-26 ENCOUNTER — Ambulatory Visit

## 2024-01-26 ENCOUNTER — Encounter: Payer: Self-pay | Admitting: Women's Health

## 2024-01-26 ENCOUNTER — Ambulatory Visit: Admitting: Women's Health

## 2024-01-26 VITALS — BP 114/80 | HR 106 | Wt 234.0 lb

## 2024-01-26 DIAGNOSIS — O0991 Supervision of high risk pregnancy, unspecified, first trimester: Secondary | ICD-10-CM | POA: Diagnosis not present

## 2024-01-26 DIAGNOSIS — Z131 Encounter for screening for diabetes mellitus: Secondary | ICD-10-CM

## 2024-01-26 DIAGNOSIS — Z113 Encounter for screening for infections with a predominantly sexual mode of transmission: Secondary | ICD-10-CM

## 2024-01-26 DIAGNOSIS — Z3A12 12 weeks gestation of pregnancy: Secondary | ICD-10-CM

## 2024-01-26 DIAGNOSIS — F419 Anxiety disorder, unspecified: Secondary | ICD-10-CM

## 2024-01-26 DIAGNOSIS — Z3480 Encounter for supervision of other normal pregnancy, unspecified trimester: Secondary | ICD-10-CM | POA: Diagnosis present

## 2024-01-26 DIAGNOSIS — Z1332 Encounter for screening for maternal depression: Secondary | ICD-10-CM

## 2024-01-26 DIAGNOSIS — Z8759 Personal history of other complications of pregnancy, childbirth and the puerperium: Secondary | ICD-10-CM | POA: Diagnosis not present

## 2024-01-26 DIAGNOSIS — Z124 Encounter for screening for malignant neoplasm of cervix: Secondary | ICD-10-CM

## 2024-01-26 DIAGNOSIS — Z3682 Encounter for antenatal screening for nuchal translucency: Secondary | ICD-10-CM | POA: Diagnosis not present

## 2024-01-26 DIAGNOSIS — O0990 Supervision of high risk pregnancy, unspecified, unspecified trimester: Secondary | ICD-10-CM | POA: Insufficient documentation

## 2024-01-26 DIAGNOSIS — Z6841 Body Mass Index (BMI) 40.0 and over, adult: Secondary | ICD-10-CM

## 2024-01-26 MED ORDER — ASPIRIN 81 MG PO TBEC: 162.0000 mg | DELAYED_RELEASE_TABLET | Freq: Every day | ORAL | 2 refills | Status: DC

## 2024-01-26 NOTE — Progress Notes (Signed)
 INITIAL OBSTETRICAL VISIT Patient name: Lauren Guzman MRN [id number]  Date of birth: 09-11-1995 Chief Complaint:   Initial Prenatal Visit  History of Present Illness:   Lauren Guzman is a 28 y.o. G33P1001 African-American female at [redacted]w[redacted]d by US  at 7 weeks with an Estimated Date of Delivery: 08/07/24 being seen today for her initial obstetrical visit.   No LMP recorded (lmp unknown). Patient is pregnant. Her obstetrical history is significant for term SVB, GHTN.   Today she reports some nausea, vomited for 1st time yesterday, declines meds.  Last pap 2023. Results were: negative per pt report at clinic in [facility]     01/26/2024   10:01 AM 11/11/2022    9:18 AM 07/29/2022    2:43 PM  Depression screen PHQ 2/9  Decreased Interest 0 0 0  Down, Depressed, Hopeless 0 0 0  PHQ - 2 Score 0 0 0  Altered sleeping 0 0 0  Tired, decreased energy 0 0 0  Change in appetite 0 0 0  Feeling bad or failure about yourself  0 0 0  Trouble concentrating 0 0 0  Moving slowly or fidgety/restless 0 0 0  Suicidal thoughts 0 0 0  PHQ-9 Score 0 0 0        01/26/2024   10:01 AM 11/11/2022    9:18 AM 07/29/2022    2:43 PM  GAD 7 : Generalized Anxiety Score  Nervous, Anxious, on Edge 0 0 0  Control/stop worrying 0 0 0  Worry too much - different things 0 0 0  Trouble relaxing 0 0 0  Restless 0 0 0  Easily annoyed or irritable 0 0 1  Afraid - awful might happen 0 0 0  Total GAD 7 Score 0 0 1     Review of Systems:   Pertinent items are noted in HPI Denies cramping/contractions, leakage of fluid, vaginal bleeding, abnormal vaginal discharge w/ itching/odor/irritation, headaches, visual changes, shortness of breath, chest pain, abdominal pain, severe nausea/vomiting, or problems with urination or bowel movements unless otherwise stated above.  Pertinent History Reviewed:  Reviewed past medical,surgical, social, obstetrical and family history.  Reviewed problem list, medications and allergies. OB  History  Gravida Para Term Preterm AB Living  2 1 1   1   SAB IAB Ectopic Multiple Live Births     0 1    # Outcome Date GA Lbr Len/2nd Weight Sex Type Anes PTL Lv  2 Current           1 Term 02/03/23 [redacted]w[redacted]d 12:40 / 00:47 6 lb 5.2 oz (2.87 kg) M Vag-Spont EPI  LIV     Complications: Gestational hypertension   Physical Assessment:   Vitals:   01/26/24 0954  BP: 114/80  Pulse: (!) 106  Weight: 234 lb (106.1 kg)  Body mass index is 44.21 kg/m.       Physical Examination:  General appearance - well appearing, and in no distress  Mental status - alert, oriented to person, place, and time  Psych:  She has a normal mood and affect  Skin - warm and dry, normal color, no suspicious lesions noted  Chest - effort normal, all lung fields clear to auscultation bilaterally  Heart - normal rate and regular rhythm  Abdomen - soft, nontender  Extremities:  No swelling or varicosities noted  Pelvic - VULVA: normal appearing vulva with no masses, tenderness or lesions  VAGINA: normal appearing vagina with normal color and discharge, no lesions  CERVIX:  normal appearing cervix without discharge or lesions, no CMT  Thin prep pap is done w/ HR HPV cotesting  Chaperone: Latisha Cresenzo  TODAY'S NT US  12+2 wks,measurements c/w dates,CRL 57.13 mm,NB present,NT 1.7 mm,normal ovaries,FHR 167 bpm,anterior placenta   No results found for this or any previous visit (from the past 24 hours).  Assessment & Plan:  1) High-Risk Pregnancy G2P1001 at [redacted]w[redacted]d with an Estimated Date of Delivery: 08/07/24   2) Initial OB visit  3) PGBMI 44> ASA  4) H/O gHTN> ASA, baseline labs  5) Anxiety> no meds, doing well, requests IBH referral  Meds:  Meds ordered this encounter  Medications   aspirin  EC 81 MG tablet    Sig: Take 2 tablets (162 mg total) by mouth daily. Swallow whole.    Dispense:  180 tablet    Refill:  2    Initial labs obtained Continue prenatal vitamins Reviewed n/v relief measures and  warning s/s to report Reviewed recommended weight gain based on pre-gravid BMI Encouraged well-balanced diet Genetic & carrier screening discussed: requests Panorama and NT/IT, neg prev preg Ultrasound discussed; fetal survey: requested CCNC completed> form faxed if has or is planning to apply for medicaid The nature of [facility] [employer] with multiple MDs and other Advanced Practice Providers was explained to patient; also emphasized that fellows, residents, and students are part of our team. Does have home bp cuff. Office bp cuff given: no. Rx sent: n/a. Check bp weekly, let us  know if consistently >140/90.   Follow-up: Return in about 4 weeks (around 02/23/2024) for HROB, 2nd IT, MD/CNM, in person; then 8wks from now anatomy u/s and HROB w/ MD/CNM.   Orders Placed This Encounter  Procedures   Urine Culture   Integrated 1   Hemoglobin A1c   PANORAMA PRENATAL TEST   CBC/D/Plt+RPR+Rh+ABO+RubIgG...   Comprehensive metabolic panel with GFR   Protein / creatinine ratio, urine   Amb ref to [facility]    Ferd Householder CNM, [facility] 01/26/2024 10:20 AM

## 2024-01-26 NOTE — Progress Notes (Signed)
 US  12+2 wks,measurements c/w dates,CRL 57.13 mm,NB present,NT 1.7 mm,normal ovaries,FHR 167 bpm,anterior placenta

## 2024-01-26 NOTE — Patient Instructions (Signed)
Lauren Guzman, thank you for choosing our office today! We appreciate the opportunity to meet your healthcare needs. You may receive a short survey by mail, e-mail, or through [employer]. If you are happy with your care we would appreciate if you could take just a few minutes to complete the survey questions. We read all of your comments and take your feedback very seriously. Thank you again for choosing our office.  Center for [employer] [facility] Team at [facility]  [facility] at [facility] ([address] [place], [place] [address]) Entrance C, located off of [address] Free 24/7 valet parking   Nausea & Vomiting Have saltine crackers or pretzels by your bed and eat a few bites before you raise your head out of bed in the morning Eat small frequent meals throughout the day instead of large meals Drink plenty of fluids throughout the day to stay hydrated, just don't drink a lot of fluids with your meals.  This can make your stomach fill up faster making you feel sick Do not brush your teeth right after you eat Products with real ginger are good for nausea, like ginger ale and ginger hard candy Make sure it says made with real ginger! Sucking on sour candy like lemon heads is also good for nausea If your prenatal vitamins make you nauseated, take them at night so you will sleep through the nausea Sea Bands If you feel like you need medicine for the nausea & vomiting please let us know If you are unable to keep any fluids or food down please let us know   Constipation Drink plenty of fluid, preferably water, throughout the day Eat foods high in fiber such as fruits, vegetables, and grains Exercise, such as walking, is a good way to keep your bowels regular Drink warm fluids, especially warm prune juice, or decaf coffee Eat a 1/2 cup of real oatmeal (not instant), 1/2 cup applesauce, and 1/2-1 cup warm prune juice every day If needed, you may take Colace (docusate sodium) stool softener  once or twice a day to help keep the stool soft.  If you still are having problems with constipation, you may take Miralax once daily as needed to help keep your bowels regular.   Home Blood Pressure Monitoring for Patients   Your provider has recommended that you check your blood pressure (BP) at least once a week at home. If you do not have a blood pressure cuff at home, one will be provided for you. Contact your provider if you have not received your monitor within 1 week.   Helpful Tips for Accurate Home Blood Pressure Checks  Don't smoke, exercise, or drink caffeine 30 minutes before checking your BP Use the restroom before checking your BP (a full bladder can raise your pressure) Relax in a comfortable upright chair Feet on the ground Left arm resting comfortably on a flat surface at the level of your heart Legs uncrossed Back supported Sit quietly and don't talk Place the cuff on your bare arm Adjust snuggly, so that only two fingertips can fit between your skin and the top of the cuff Check 2 readings separated by at least one minute Keep a log of your BP readings For a visual, please reference this diagram: http://ccnc.care/bpdiagram  Provider Name: [facility]     Phone: [phone]  Zone 1: ALL CLEAR  Continue to monitor your symptoms:  BP reading is less than 140 (top number) or less than 90 (bottom  number)  No right upper stomach pain No headaches or seeing spots No feeling nauseated or throwing up No swelling in face and hands  Zone 2: CAUTION Call your doctor's office for any of the following:  BP reading is greater than 140 (top number) or greater than 90 (bottom number)  Stomach pain under your ribs in the middle or right side Headaches or seeing spots Feeling nauseated or throwing up Swelling in face and hands  Zone 3: EMERGENCY  Seek immediate medical care if you have any of the following:  BP reading is greater than160 (top number) or greater than  110 (bottom number) Severe headaches not improving with Tylenol Serious difficulty catching your breath Any worsening symptoms from Zone 2    First Trimester of Pregnancy The first trimester of pregnancy is from week 1 until the end of week 12 (months 1 through 3). A week after a sperm fertilizes an egg, the egg will implant on the wall of the uterus. This embryo will begin to develop into a baby. Genes from you and your partner are forming the baby. The female genes determine whether the baby is a boy or a girl. At 6-8 weeks, the eyes and face are formed, and the heartbeat can be seen on ultrasound. At the end of 12 weeks, all the baby's organs are formed.  Now that you are pregnant, you will want to do everything you can to have a healthy baby. Two of the most important things are to get good prenatal care and to follow your health care provider's instructions. Prenatal care is all the medical care you receive before the baby's birth. This care will help prevent, find, and treat any problems during the pregnancy and childbirth. BODY CHANGES Your body goes through many changes during pregnancy. The changes vary from woman to woman.  You may gain or lose a couple of pounds at first. You may feel sick to your stomach (nauseous) and throw up (vomit). If the vomiting is uncontrollable, call your health care provider. You may tire easily. You may develop headaches that can be relieved by medicines approved by your health care provider. You may urinate more often. Painful urination may mean you have a bladder infection. You may develop heartburn as a result of your pregnancy. You may develop constipation because certain hormones are causing the muscles that push waste through your intestines to slow down. You may develop hemorrhoids or swollen, bulging veins (varicose veins). Your breasts may begin to grow larger and become tender. Your nipples may stick out more, and the tissue that surrounds them  (areola) may become darker. Your gums may bleed and may be sensitive to brushing and flossing. Dark spots or blotches (chloasma, mask of pregnancy) may develop on your face. This will likely fade after the baby is born. Your menstrual periods will stop. You may have a loss of appetite. You may develop cravings for certain kinds of food. You may have changes in your emotions from day to day, such as being excited to be pregnant or being concerned that something may go wrong with the pregnancy and baby. You may have more vivid and strange dreams. You may have changes in your hair. These can include thickening of your hair, rapid growth, and changes in texture. Some women also have hair loss during or after pregnancy, or hair that feels dry or thin. Your hair will most likely return to normal after your baby is born. WHAT TO EXPECT AT YOUR PRENATAL  VISITS During a routine prenatal visit: You will be weighed to make sure you and the baby are growing normally. Your blood pressure will be taken. Your abdomen will be measured to track your baby's growth. The fetal heartbeat will be listened to starting around week 10 or 12 of your pregnancy. Test results from any previous visits will be discussed. Your health care provider may ask you: How you are feeling. If you are feeling the baby move. If you have had any abnormal symptoms, such as leaking fluid, bleeding, severe headaches, or abdominal cramping. If you have any questions. Other tests that may be performed during your first trimester include: Blood tests to find your blood type and to check for the presence of any previous infections. They will also be used to check for low iron levels (anemia) and Rh antibodies. Later in the pregnancy, blood tests for diabetes will be done along with other tests if problems develop. Urine tests to check for infections, diabetes, or protein in the urine. An ultrasound to confirm the proper growth and development  of the baby. An amniocentesis to check for possible genetic problems. Fetal screens for spina bifida and Down syndrome. You may need other tests to make sure you and the baby are doing well. HOME CARE INSTRUCTIONS  Medicines Follow your health care provider's instructions regarding medicine use. Specific medicines may be either safe or unsafe to take during pregnancy. Take your prenatal vitamins as directed. If you develop constipation, try taking a stool softener if your health care provider approves. Diet Eat regular, well-balanced meals. Choose a variety of foods, such as meat or vegetable-based protein, fish, milk and low-fat dairy products, vegetables, fruits, and whole grain breads and cereals. Your health care provider will help you determine the amount of weight gain that is right for you. Avoid raw meat and uncooked cheese. These carry germs that can cause birth defects in the baby. Eating four or five small meals rather than three large meals a day may help relieve nausea and vomiting. If you start to feel nauseous, eating a few soda crackers can be helpful. Drinking liquids between meals instead of during meals also seems to help nausea and vomiting. If you develop constipation, eat more high-fiber foods, such as fresh vegetables or fruit and whole grains. Drink enough fluids to keep your urine clear or pale yellow. Activity and Exercise Exercise only as directed by your health care provider. Exercising will help you: Control your weight. Stay in shape. Be prepared for labor and delivery. Experiencing pain or cramping in the lower abdomen or low back is a good sign that you should stop exercising. Check with your health care provider before continuing normal exercises. Try to avoid standing for long periods of time. Move your legs often if you must stand in one place for a long time. Avoid heavy lifting. Wear low-heeled shoes, and practice good posture. You may continue to have sex  unless your health care provider directs you otherwise. Relief of Pain or Discomfort Wear a good support bra for breast tenderness.   Take warm sitz baths to soothe any pain or discomfort caused by hemorrhoids. Use hemorrhoid cream if your health care provider approves.   Rest with your legs elevated if you have leg cramps or low back pain. If you develop varicose veins in your legs, wear support hose. Elevate your feet for 15 minutes, 3-4 times a day. Limit salt in your diet. Prenatal Care Schedule your prenatal visits by the  twelfth week of pregnancy. They are usually scheduled monthly at first, then more often in the last 2 months before delivery. Write down your questions. Take them to your prenatal visits. Keep all your prenatal visits as directed by your health care provider. Safety Wear your seat belt at all times when driving. Make a list of emergency phone numbers, including numbers for family, friends, the hospital, and police and fire departments. General Tips Ask your health care provider for a referral to a local prenatal education class. Begin classes no later than at the beginning of month 6 of your pregnancy. Ask for help if you have counseling or nutritional needs during pregnancy. Your health care provider can offer advice or refer you to specialists for help with various needs. Do not use hot tubs, steam rooms, or saunas. Do not douche or use tampons or scented sanitary pads. Do not cross your legs for long periods of time. Avoid cat litter boxes and soil used by cats. These carry germs that can cause birth defects in the baby and possibly loss of the fetus by miscarriage or stillbirth. Avoid all smoking, herbs, alcohol, and medicines not prescribed by your health care provider. Chemicals in these affect the formation and growth of the baby. Schedule a dentist appointment. At home, brush your teeth with a soft toothbrush and be gentle when you floss. SEEK MEDICAL CARE IF:   You have dizziness. You have mild pelvic cramps, pelvic pressure, or nagging pain in the abdominal area. You have persistent nausea, vomiting, or diarrhea. You have a bad smelling vaginal discharge. You have pain with urination. You notice increased swelling in your face, hands, legs, or ankles. SEEK IMMEDIATE MEDICAL CARE IF:  You have a fever. You are leaking fluid from your vagina. You have spotting or bleeding from your vagina. You have severe abdominal cramping or pain. You have rapid weight gain or loss. You vomit blood or material that looks like coffee grounds. You are exposed to [place] measles and have never had them. You are exposed to fifth disease or chickenpox. You develop a severe headache. You have shortness of breath. You have any kind of trauma, such as from a fall or a car accident. Document Released: 08/19/2001 Document Revised: 01/09/2014 Document Reviewed: 07/05/2013 [facility] Patient Information 2015 [place], [place]. This information is not intended to replace advice given to you by your health care provider. Make sure you discuss any questions you have with your health care provider.

## 2024-01-28 LAB — CBC/D/PLT+RPR+RH+ABO+RUBIGG...
Antibody Screen: NEGATIVE
Basophils Absolute: 0 10*3/uL (ref 0.0–0.2)
Basos: 0 %
EOS (ABSOLUTE): 0.1 10*3/uL (ref 0.0–0.4)
Eos: 1 %
HCV Ab: NONREACTIVE
HIV Screen 4th Generation wRfx: NONREACTIVE
Hematocrit: 38.9 % (ref 34.0–46.6)
Hemoglobin: 12.6 g/dL (ref 11.1–15.9)
Hepatitis B Surface Ag: NEGATIVE
Immature Grans (Abs): 0 10*3/uL (ref 0.0–0.1)
Immature Granulocytes: 0 %
Lymphocytes Absolute: 1.7 10*3/uL (ref 0.7–3.1)
Lymphs: 26 %
MCH: 27.8 pg (ref 26.6–33.0)
MCHC: 32.4 g/dL (ref 31.5–35.7)
MCV: 86 fL (ref 79–97)
Monocytes Absolute: 0.4 10*3/uL (ref 0.1–0.9)
Monocytes: 6 %
Neutrophils Absolute: 4.2 10*3/uL (ref 1.4–7.0)
Neutrophils: 67 %
Platelets: 297 10*3/uL (ref 150–450)
RBC: 4.53 x10E6/uL (ref 3.77–5.28)
RDW: 12.8 % (ref 11.7–15.4)
RPR Ser Ql: NONREACTIVE
Rh Factor: POSITIVE
Rubella Antibodies, IGG: 4.31 {index} (ref >0.99)
WBC: 6.4 10*3/uL (ref 3.4–10.8)

## 2024-01-28 LAB — INTEGRATED 1
Crown Rump Length: 57.1 mm
Gest. Age on Collection Date: 12.1 wk
Maternal Age at EDD: 28.3 a
Nuchal Translucency (NT): 1.7 mm
Number of Fetuses: 1
PAPP-A Value: 537.8 ng/mL
Sonographer ID#: 309760
Weight: 234 [lb_av]

## 2024-01-28 LAB — HEMOGLOBIN A1C
Est. average glucose Bld gHb Est-mCnc: 108 mg/dL
Hgb A1c MFr Bld: 5.4 % (ref 4.8–5.6)

## 2024-01-28 LAB — URINE CULTURE

## 2024-01-28 LAB — COMPREHENSIVE METABOLIC PANEL WITH GFR
ALT: 10 IU/L (ref 0–32)
AST: 10 IU/L (ref 0–40)
Albumin: 4.1 g/dL (ref 4.0–5.0)
Alkaline Phosphatase: 61 IU/L (ref 44–121)
BUN/Creatinine Ratio: 13 (ref 9–23)
BUN: 7 mg/dL (ref 6–20)
Bilirubin Total: <0.2 mg/dL (ref 0.0–1.2)
CO2: 22 mmol/L (ref 20–29)
Calcium: 9.5 mg/dL (ref 8.7–10.2)
Chloride: 101 mmol/L (ref 96–106)
Creatinine, Ser: 0.52 mg/dL — ABNORMAL LOW (ref 0.57–1.00)
Globulin, Total: 3.1 g/dL (ref 1.5–4.5)
Glucose: 88 mg/dL (ref 70–99)
Potassium: 4.5 mmol/L (ref 3.5–5.2)
Sodium: 136 mmol/L (ref 134–144)
Total Protein: 7.2 g/dL (ref 6.0–8.5)
eGFR: 131 mL/min/{1.73_m2} (ref >59)

## 2024-01-28 LAB — PROTEIN / CREATININE RATIO, URINE
Creatinine, Urine: 305.3 mg/dL
Protein, Ur: 36 mg/dL
Protein/Creat Ratio: 118 mg/g{creat} (ref 0–200)

## 2024-01-28 LAB — HCV INTERPRETATION

## 2024-02-03 ENCOUNTER — Ambulatory Visit: Payer: Self-pay | Admitting: Women's Health

## 2024-02-03 DIAGNOSIS — O285 Abnormal chromosomal and genetic finding on antenatal screening of mother: Secondary | ICD-10-CM

## 2024-02-03 DIAGNOSIS — A749 Chlamydial infection, unspecified: Secondary | ICD-10-CM | POA: Insufficient documentation

## 2024-02-03 DIAGNOSIS — O0990 Supervision of high risk pregnancy, unspecified, unspecified trimester: Secondary | ICD-10-CM

## 2024-02-03 DIAGNOSIS — O0992 Supervision of high risk pregnancy, unspecified, second trimester: Secondary | ICD-10-CM

## 2024-02-03 LAB — CYTOLOGY - PAP
Chlamydia: POSITIVE — AB
Comment: NEGATIVE
Comment: NEGATIVE
Comment: NEGATIVE
Comment: NEGATIVE
Comment: NORMAL
Diagnosis: NEGATIVE
HPV 16: NEGATIVE
HPV 18 / 45: NEGATIVE
High risk HPV: POSITIVE — AB
Neisseria Gonorrhea: NEGATIVE

## 2024-02-03 MED ORDER — AZITHROMYCIN 500 MG PO TABS: 1000.0000 mg | ORAL_TABLET | Freq: Once | ORAL | 0 refills | Status: AC

## 2024-02-04 DIAGNOSIS — R87619 Unspecified abnormal cytological findings in specimens from cervix uteri: Secondary | ICD-10-CM | POA: Insufficient documentation

## 2024-02-08 LAB — PANORAMA PRENATAL TEST FULL PANEL:PANORAMA TEST PLUS 5 ADDITIONAL MICRODELETIONS: FETAL FRACTION: 8.7

## 2024-02-11 ENCOUNTER — Ambulatory Visit

## 2024-02-11 ENCOUNTER — Other Ambulatory Visit (HOSPITAL_COMMUNITY)
Admission: RE | Admit: 2024-02-11 | Discharge: 2024-02-11 | Disposition: A | Discharge status: Home / Self Care | Source: Ambulatory Visit | Attending: Obstetrics & Gynecology | Admitting: Obstetrics & Gynecology

## 2024-02-11 DIAGNOSIS — N898 Other specified noninflammatory disorders of vagina: Secondary | ICD-10-CM | POA: Insufficient documentation

## 2024-02-11 NOTE — Progress Notes (Signed)
   NURSE VISIT- VAGINITIS/STD  SUBJECTIVE:  Lauren Guzman is a 28 y.o. G2P1001 [redacted]w[redacted]d pregnantfemale here for a vaginal swab for vaginitis screening, STD screen.  She reports the following symptoms: discharge described as white, local irritation, and vulvar itching for 2 days. Denies abnormal vaginal bleeding, significant pelvic pain, fever, or UTI symptoms.  OBJECTIVE:  LMP  (LMP Unknown)   Appears well, in no apparent distress  ASSESSMENT: Vaginal swab for vaginitis screening  PLAN: Self-collected vaginal probe for Gonorrhea, Chlamydia, Trichomonas, Bacterial Vaginosis, Yeast sent to lab Treatment: to be determined once results are received Follow-up as needed if symptoms persist/worsen, or new symptoms develop  Alyssa Jumper  02/11/2024 11:29 AM

## 2024-02-12 ENCOUNTER — Inpatient Hospital Stay (HOSPITAL_COMMUNITY)
Admission: AD | Admit: 2024-02-12 | Discharge: 2024-02-12 | Disposition: A | Discharge status: Home / Self Care | Attending: Obstetrics and Gynecology | Admitting: Obstetrics and Gynecology

## 2024-02-12 ENCOUNTER — Other Ambulatory Visit: Payer: Self-pay

## 2024-02-12 DIAGNOSIS — B9689 Other specified bacterial agents as the cause of diseases classified elsewhere: Secondary | ICD-10-CM

## 2024-02-12 DIAGNOSIS — Z3A14 14 weeks gestation of pregnancy: Secondary | ICD-10-CM | POA: Diagnosis not present

## 2024-02-12 DIAGNOSIS — O98812 Other maternal infectious and parasitic diseases complicating pregnancy, second trimester: Secondary | ICD-10-CM

## 2024-02-12 DIAGNOSIS — N760 Acute vaginitis: Secondary | ICD-10-CM | POA: Diagnosis not present

## 2024-02-12 DIAGNOSIS — B3731 Acute candidiasis of vulva and vagina: Secondary | ICD-10-CM

## 2024-02-12 DIAGNOSIS — A749 Chlamydial infection, unspecified: Secondary | ICD-10-CM

## 2024-02-12 DIAGNOSIS — O23592 Infection of other part of genital tract in pregnancy, second trimester: Secondary | ICD-10-CM | POA: Insufficient documentation

## 2024-02-12 LAB — CERVICOVAGINAL ANCILLARY ONLY
Bacterial Vaginitis (gardnerella): POSITIVE — AB
Candida Glabrata: NEGATIVE
Candida Vaginitis: POSITIVE — AB
Chlamydia: POSITIVE — AB
Comment: NEGATIVE
Comment: NEGATIVE
Comment: NEGATIVE
Comment: NEGATIVE
Comment: NEGATIVE
Comment: NORMAL
Neisseria Gonorrhea: NEGATIVE
Trichomonas: NEGATIVE

## 2024-02-12 MED ORDER — TERCONAZOLE 0.8 % VA CREA
1.0000 | TOPICAL_CREAM | Freq: Every day | VAGINAL | 0 refills | Status: AC
Start: 2024-02-12 — End: ?

## 2024-02-12 MED ORDER — AZITHROMYCIN 250 MG PO TABS
1000.0000 mg | ORAL_TABLET | Freq: Once | ORAL | 0 refills | Status: AC
Start: 2024-02-12 — End: 2024-02-12

## 2024-02-12 MED ORDER — METRONIDAZOLE 500 MG PO TABS: 500.0000 mg | ORAL_TABLET | Freq: Two times a day (BID) | ORAL | 0 refills | Status: DC

## 2024-02-12 NOTE — MAU Note (Signed)
 Lauren Guzman is a 28 y.o. at [redacted]w[redacted]d here in MAU reporting: she's having spotting that began on Tuesday.  Reports she was seen yesterday for office visit and performed self vaginal swabs.  States wants results from swabs.  States she's having pelvic pain and vaginal pain.  Requesting treatment if abnormal results from vaginal swabs.  LMP: Unknown Onset of complaint: yesterday Pain score: 3 Vitals:   02/12/24 1758  BP: (!) 143/83  Pulse: (!) 120  Resp: 18  Temp: 98.7 F (37.1 C)  SpO2: 100%     FHT: 169 bpm  Lab orders placed from triage: None

## 2024-02-12 NOTE — MAU Provider Note (Signed)
  S Ms. Lauren Guzman is a 28 y.o. G2P1001 patient who presents to MAU today with complaint of seen at her OB's office yesterday for vaginitis and STD screen. Results were positive for Chlamydia, BV, and yeast therefore will send medications for patient and treat patient per CDC guidelines    O BP (!) 143/83 (BP Location: Right Arm)   Pulse (!) 120   Temp 98.7 F (37.1 C) (Oral)   Resp 18   Ht 5\' 2"  (1.575 m)   Wt 105 kg   LMP  (LMP Unknown)   SpO2 100%   BMI 42.32 kg/m  Physical Exam Vitals and nursing note reviewed.  Constitutional:      General: She is not in acute distress.    Appearance: Normal appearance. She is obese. She is not ill-appearing.  HENT:     Head: Normocephalic.     Nose: Nose normal.     Mouth/Throat:     Mouth: Mucous membranes are moist.  Cardiovascular:     Rate and Rhythm: Normal rate.  Pulmonary:     Effort: Pulmonary effort is normal.  Abdominal:     Palpations: Abdomen is soft.  Musculoskeletal:        General: Normal range of motion.     Cervical back: Normal range of motion.  Skin:    General: Skin is warm.  Neurological:     Mental Status: She is alert and oriented to person, place, and time.  Psychiatric:        Behavior: Behavior normal.    FHT: 169 bpm   MDM  Moderate  Medical screening exam performed  Positive vaginal cultures for Chlamydia, BV, and yeast (02/11/24) Rx sent per CDC Guidelines  Azithromycin , Flagyl , and Terazol 3 RX provided at discharge S/R/B of medication discussed with patient and she is aware of partner notification and treatment and will plan for [facility] at [facility] office   Orders Placed This Encounter  Procedures   Discharge patient Discharge disposition: 01-Home or Self Care; Discharge patient date: 02/12/2024    Standing Status:   Standing    Number of Occurrences:   1    Discharge disposition:   01-Home or Self Care [1]    Discharge patient date:   02/12/2024     I have reviewed the patient chart and  performed the physical exam .Medications ordered as stated below.  A/P as described below.  Counseling and education provided and patient agreeable  with plan as described below. Verbalized understanding.    ASSESSMENT Medical screening exam complete  1. Chlamydia infection affecting pregnancy in second trimester (Primary)  2. Bacterial vaginosis  3. Yeast vaginitis  4. [redacted] weeks gestation of pregnancy    PLAN Future Appointments  Date Time Provider Department Center  02/23/2024  9:30 AM Keene Pastures, DO [facility]  03/23/2024  9:00 AM [facility] PROVIDER 1 [facility]  03/23/2024  9:30 AM [facility] US4 [facility]  03/24/2024 10:50 AM Keene Pastures, DO [facility]    Discharge from MAU in stable condition  See AVS for full description of educational information and instructions provided to the patient at time of discharge  Warning signs for worsening condition that would warrant emergency follow-up discussed  Patient may return to MAU as needed   Cherlynn Cornfield, NP 02/12/2024 6:20 PM

## 2024-02-15 ENCOUNTER — Ambulatory Visit

## 2024-02-15 ENCOUNTER — Ambulatory Visit: Payer: Self-pay | Admitting: Women's Health

## 2024-02-15 DIAGNOSIS — A749 Chlamydial infection, unspecified: Secondary | ICD-10-CM

## 2024-02-23 ENCOUNTER — Ambulatory Visit: Admitting: Obstetrics & Gynecology

## 2024-02-23 VITALS — BP 132/80 | HR 114 | Wt 232.0 lb

## 2024-02-23 DIAGNOSIS — A749 Chlamydial infection, unspecified: Secondary | ICD-10-CM | POA: Diagnosis not present

## 2024-02-23 DIAGNOSIS — Z3A16 16 weeks gestation of pregnancy: Secondary | ICD-10-CM | POA: Diagnosis not present

## 2024-02-23 DIAGNOSIS — O0990 Supervision of high risk pregnancy, unspecified, unspecified trimester: Secondary | ICD-10-CM

## 2024-02-23 DIAGNOSIS — O98312 Other infections with a predominantly sexual mode of transmission complicating pregnancy, second trimester: Secondary | ICD-10-CM

## 2024-02-23 LAB — POCT URINALYSIS DIPSTICK OB
Blood, UA: NEGATIVE
Glucose, UA: NEGATIVE
Ketones, UA: NEGATIVE
Nitrite, UA: NEGATIVE

## 2024-02-23 NOTE — Progress Notes (Addendum)
   LOW-RISK PREGNANCY VISIT Patient name: Lauren Guzman MRN [id number]  Date of birth: [birth year] Chief Complaint:   Routine Prenatal Visit  History of Present Illness:   Lauren Guzman is a 28 y.o. G71P1001 female at [redacted]w[redacted]d with an Estimated Date of Delivery: 08/07/24 being seen today for ongoing management of a low-risk pregnancy.   -abnormal sex chromosome -Chlamydia- treated, []  TOC today      01/26/2024   10:01 AM 11/11/2022    9:18 AM 07/29/2022    2:43 PM  Depression screen PHQ 2/9  Decreased Interest 0 0 0  Down, Depressed, Hopeless 0 0 0  PHQ - 2 Score 0 0 0  Altered sleeping 0 0 0  Tired, decreased energy 0 0 0  Change in appetite 0 0 0  Feeling bad or failure about yourself  0 0 0  Trouble concentrating 0 0 0  Moving slowly or fidgety/restless 0 0 0  Suicidal thoughts 0 0 0  PHQ-9 Score 0 0 0    Today she reports no complaints. Contractions: Not present. Vag. Bleeding: None.  Movement: Present. denies leaking of fluid. Review of Systems:   Pertinent items are noted in HPI Denies abnormal vaginal discharge w/ itching/odor/irritation, headaches, visual changes, shortness of breath, chest pain, abdominal pain, severe nausea/vomiting, or problems with urination or bowel movements unless otherwise stated above. Pertinent History Reviewed:  Reviewed past medical,surgical, social, obstetrical and family history.  Reviewed problem list, medications and allergies.  Physical Assessment:   Vitals:   02/23/24 0936  BP: 132/80  Pulse: (!) 114  Weight: 232 lb (105.2 kg)  Body mass index is 42.43 kg/m.        Physical Examination:   General appearance: Well appearing, and in no distress  Mental status: Alert, oriented to person, place, and time  Skin: Warm & dry  Respiratory: Normal respiratory effort, no distress  Abdomen: Soft, gravid, nontender  Pelvic: Cervical exam deferred         Extremities:  no edema  Psych:  mood and affect appropriate  Fetal Status: Fetal  Heart Rate (bpm): 150   Movement: Present    Chaperone: n/a    No results found for this or any previous visit (from the past 24 hours).   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  Labs/procedures today: IT2  Plan:  Continue routine obstetrical care  Next visit: prefers in person    Reviewed: Preterm labor symptoms and general obstetric precautions including but not limited to vaginal bleeding, contractions, leaking of fluid and fetal movement were reviewed in detail with the patient.  All questions were answered. {T has home bp cuff. Check bp weekly, let us  know if >140/90.   Follow-up: Return in about 4 weeks (around 03/22/2024) for LROB visit.  Orders Placed This Encounter  Procedures   INTEGRATED 2    Larraine Argo, DO Attending Obstetrician & Gynecologist, [facility] for [employer], [facility]

## 2024-02-23 NOTE — Addendum Note (Signed)
 Addended by: Demarques Pilz I on: 02/23/2024 09:54 AM   Modules accepted: Orders

## 2024-02-25 ENCOUNTER — Ambulatory Visit: Payer: Self-pay | Admitting: Obstetrics & Gynecology

## 2024-02-25 LAB — GC/CHLAMYDIA PROBE AMP
Chlamydia trachomatis, NAA: NEGATIVE
Neisseria Gonorrhoeae by PCR: NEGATIVE

## 2024-02-29 LAB — INTEGRATED 2
AFP MoM: 0.83
Alpha-Fetoprotein: 20.2 ng/mL
Crown Rump Length: 57.1 mm
DIA MoM: 1.34
DIA Value: 166.7 pg/mL
Estriol, Unconjugated: 1.04 ng/mL
Gest. Age on Collection Date: 12.1 wk
Gestational Age: 16.1 wk
Maternal Age at EDD: 28.3 a
Nuchal Translucency (NT): 1.7 mm
Nuchal Translucency MoM: 1.13
Number of Fetuses: 1
PAPP-A MoM: 1.31
PAPP-A Value: 537.8 ng/mL
Sonographer ID#: 309760
Test Results:: NEGATIVE
Weight: 234 [lb_av]
Weight: 234 [lb_av]
hCG MoM: 1.24
hCG Value: 32.6 [IU]/mL
uE3 MoM: 1.12

## 2024-03-10 DIAGNOSIS — O99210 Obesity complicating pregnancy, unspecified trimester: Secondary | ICD-10-CM | POA: Insufficient documentation

## 2024-03-22 ENCOUNTER — Other Ambulatory Visit

## 2024-03-22 ENCOUNTER — Encounter: Admitting: Obstetrics & Gynecology

## 2024-03-22 ENCOUNTER — Encounter: Admitting: Women's Health

## 2024-03-23 ENCOUNTER — Other Ambulatory Visit: Payer: Self-pay | Admitting: *Deleted

## 2024-03-23 ENCOUNTER — Ambulatory Visit (HOSPITAL_BASED_OUTPATIENT_CLINIC_OR_DEPARTMENT_OTHER): Admitting: Maternal & Fetal Medicine

## 2024-03-23 ENCOUNTER — Ambulatory Visit: Attending: Women's Health

## 2024-03-23 VITALS — BP 116/69 | HR 114

## 2024-03-23 DIAGNOSIS — O99212 Obesity complicating pregnancy, second trimester: Secondary | ICD-10-CM | POA: Diagnosis not present

## 2024-03-23 DIAGNOSIS — O285 Abnormal chromosomal and genetic finding on antenatal screening of mother: Secondary | ICD-10-CM | POA: Insufficient documentation

## 2024-03-23 DIAGNOSIS — Z3A20 20 weeks gestation of pregnancy: Secondary | ICD-10-CM | POA: Insufficient documentation

## 2024-03-23 DIAGNOSIS — Z362 Encounter for other antenatal screening follow-up: Secondary | ICD-10-CM

## 2024-03-23 DIAGNOSIS — O99210 Obesity complicating pregnancy, unspecified trimester: Secondary | ICD-10-CM | POA: Insufficient documentation

## 2024-03-23 DIAGNOSIS — O0990 Supervision of high risk pregnancy, unspecified, unspecified trimester: Secondary | ICD-10-CM | POA: Insufficient documentation

## 2024-03-23 DIAGNOSIS — O0992 Supervision of high risk pregnancy, unspecified, second trimester: Secondary | ICD-10-CM | POA: Insufficient documentation

## 2024-03-23 DIAGNOSIS — Z8759 Personal history of other complications of pregnancy, childbirth and the puerperium: Secondary | ICD-10-CM

## 2024-03-23 DIAGNOSIS — O09292 Supervision of pregnancy with other poor reproductive or obstetric history, second trimester: Secondary | ICD-10-CM

## 2024-03-23 DIAGNOSIS — E669 Obesity, unspecified: Secondary | ICD-10-CM | POA: Diagnosis not present

## 2024-03-24 ENCOUNTER — Encounter: Payer: Self-pay | Admitting: Obstetrics & Gynecology

## 2024-03-24 ENCOUNTER — Ambulatory Visit: Admitting: Obstetrics & Gynecology

## 2024-03-24 VITALS — BP 126/81 | HR 116 | Wt 236.0 lb

## 2024-03-24 DIAGNOSIS — O0992 Supervision of high risk pregnancy, unspecified, second trimester: Secondary | ICD-10-CM

## 2024-03-24 DIAGNOSIS — Z6841 Body Mass Index (BMI) 40.0 and over, adult: Secondary | ICD-10-CM

## 2024-03-24 DIAGNOSIS — Z3A20 20 weeks gestation of pregnancy: Secondary | ICD-10-CM

## 2024-03-24 DIAGNOSIS — O0990 Supervision of high risk pregnancy, unspecified, unspecified trimester: Secondary | ICD-10-CM

## 2024-03-24 NOTE — Progress Notes (Signed)
 After review, MFM consult with provider is not indicated for today  Lauren Nathanel Pipe, MD 03/24/2024 5:40 PM  [facility]

## 2024-03-24 NOTE — Progress Notes (Signed)
 HIGH-RISK PREGNANCY VISIT Patient name: Lauren Guzman MRN [id number]  Date of birth: 05-14-1996 Chief Complaint:   Routine Prenatal Visit  History of Present Illness:   Lauren Guzman is a 28 y.o. 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   Contractions: Not present. Vag. Bleeding: None.  Movement: Present. denies leaking of fluid.      01/26/2024   10:01 AM 11/11/2022    9:18 AM 07/29/2022    2:43 PM  Depression screen PHQ 2/9  Decreased Interest 0 0 0  Down, Depressed, Hopeless 0 0 0  PHQ - 2 Score 0 0 0  Altered sleeping 0 0 0  Tired, decreased energy 0 0 0  Change in appetite 0 0 0  Feeling bad or failure about yourself  0 0 0  Trouble concentrating 0 0 0  Moving slowly or fidgety/restless 0 0 0  Suicidal thoughts 0 0 0  PHQ-9 Score 0 0 0     Current Outpatient Medications  Medication Instructions   aspirin  EC 162 mg, Oral, Daily, Swallow whole.   Prenatal Vit-Fe Fumarate-FA (PRENATAL VITAMIN PO) Take by mouth.     Review of Systems:   Pertinent items are noted in HPI Denies abnormal vaginal discharge w/ itching/odor/irritation, headaches, visual changes, shortness of breath, chest pain, abdominal pain, severe nausea/vomiting, or problems with urination or bowel movements unless otherwise stated above. Pertinent History Reviewed:  Reviewed past medical,surgical, social, obstetrical and family history.  Reviewed problem list, medications and allergies. Physical Assessment:   Vitals:   03/24/24 1054  BP: 126/81  Pulse: (!) 116  Weight: 236 lb (107 kg)  Body mass index is 43.16 kg/m.           Physical Examination:   General appearance: alert, well appearing, and in no distress  Mental status: normal  mood, behavior, speech, dress, motor activity, and thought processes  Skin: warm & dry   Extremities:      Cardiovascular: normal heart rate noted  Respiratory: normal respiratory effort, no distress  Abdomen: gravid, soft, non-tender  Pelvic: Cervical exam deferred         Fetal Status: Fetal Heart Rate (bpm): 150   Movement: Present    Fetal Surveillance Testing today: doppler   Chaperone: N/A    No results found for this or any previous visit (from the past 24 hours).   Assessment & Plan:  High-risk pregnancy: G2P1001 at [redacted]w[redacted]d with an Estimated Date of Delivery: 08/07/24   1) abnormal NIPS -s/p MFM anatomy scan- no abnormalities, but incomplete Follow up scheduled, if space available pt prefers follow up scan @ [facility]  2) Obesity []  plan for serial growth scan in 3rd trimester and antepartum testing ~ 34wk  Meds: No orders of the defined types were placed in this encounter.   Labs/procedures today: none  Treatment Plan:  routine OB care  Reviewed: Preterm labor symptoms and general obstetric precautions including but not limited to vaginal bleeding, contractions, leaking of fluid and fetal movement were reviewed in detail with the patient.  All questions were answered. Pt has home bp cuff. Check bp weekly, let us  know if >140/90.   Follow-up: Return in about 4 weeks (around 04/21/2024) for LROB visit.   Future Appointments  Date  Time Provider Department Center  04/27/2024  9:15 AM [facility] PROVIDER 1 [facility]  04/27/2024  9:30 AM [facility] US5 [facility]  04/27/2024 11:30 AM [facility] US  [facility] None  04/27/2024  1:30 PM Kizzie Suzen SAUNDERS, CNM [facility]    No orders of the defined types were placed in this encounter.   Osmany Azer, DO Attending Obstetrician & Gynecologist, [facility] [employer], [facility]

## 2024-03-25 ENCOUNTER — Telehealth: Payer: Self-pay | Admitting: Clinical

## 2024-03-25 NOTE — Telephone Encounter (Signed)
Attempt call regarding referral; Left HIPPA-compliant message to call back Mikle Sternberg from [facility] at  [phone] (Ashelyn Mccravy's office).    

## 2024-04-26 ENCOUNTER — Other Ambulatory Visit: Payer: Self-pay | Admitting: Obstetrics & Gynecology

## 2024-04-26 DIAGNOSIS — Q998 Other specified chromosome abnormalities: Secondary | ICD-10-CM

## 2024-04-27 ENCOUNTER — Ambulatory Visit: Admitting: Women's Health

## 2024-04-27 ENCOUNTER — Ambulatory Visit (INDEPENDENT_AMBULATORY_CARE_PROVIDER_SITE_OTHER)

## 2024-04-27 ENCOUNTER — Ambulatory Visit

## 2024-04-27 ENCOUNTER — Encounter: Payer: Self-pay | Admitting: Women's Health

## 2024-04-27 VITALS — BP 113/75 | HR 105 | Wt 241.0 lb

## 2024-04-27 DIAGNOSIS — Z3A25 25 weeks gestation of pregnancy: Secondary | ICD-10-CM

## 2024-04-27 DIAGNOSIS — O0992 Supervision of high risk pregnancy, unspecified, second trimester: Secondary | ICD-10-CM

## 2024-04-27 DIAGNOSIS — O0990 Supervision of high risk pregnancy, unspecified, unspecified trimester: Secondary | ICD-10-CM

## 2024-04-27 DIAGNOSIS — Q998 Other specified chromosome abnormalities: Secondary | ICD-10-CM | POA: Diagnosis not present

## 2024-04-27 DIAGNOSIS — Z6841 Body Mass Index (BMI) 40.0 and over, adult: Secondary | ICD-10-CM | POA: Diagnosis not present

## 2024-04-27 NOTE — Progress Notes (Addendum)
 US  25+3 wks,cephalic,cx 3.7 cm,anterior placenta gr 1,SVP of fluid 5.7 cm,normal ovaries,FHR 160 bpm,EFW 809 g 34%,anatomy of the hands and spine complete,no obvious abnormalities

## 2024-04-27 NOTE — Patient Instructions (Signed)
 Lauren Guzman, thank you for choosing our office today! We appreciate the opportunity to meet your healthcare needs. You may receive a short survey by mail, e-mail, or through [employer]. If you are happy with your care we would appreciate if you could take just a few minutes to complete the survey questions. We read all of your comments and take your feedback very seriously. Thank you again for choosing our office.  [facility] [employer] Team at [facility]  [facility] at [facility] ([address] [place], [place] [address]) Entrance C, located off of [address] Free 24/7 valet parking   You will have your sugar test next visit.  Please do not eat or drink anything after midnight the night before you come, not even water.  You will be here for at least two hours.  Please make an appointment online for the bloodwork at [email or web address] for 8:00am (or as close to this as possible). Make sure you select the [facility] service center.   CLASSES: Go to [email or web address] to register for classes (childbirth, breastfeeding, waterbirth, infant CPR, daddy bootcamp, etc.)  Call the office [phone]) or go to [facility] if: You begin to have strong, frequent contractions Your water breaks.  Sometimes it is a big gush of fluid, sometimes it is just a trickle that keeps getting your panties wet or running down your legs You have vaginal bleeding.  It is normal to have a small amount of spotting if your cervix was checked.  You don't feel your baby moving like normal.  If you don't, get you something to eat and drink and lay down and focus on feeling your baby move.   If your baby is still not moving like normal, you should call the office or go to [facility].  Call the office [phone]) or go to [facility] hospital for these signs of pre-eclampsia: Severe headache that does not go away with Tylenol  Visual changes- seeing spots, double, blurred vision Pain under your right breast or upper  abdomen that does not go away with Tums or heartburn medicine Nausea and/or vomiting Severe swelling in your hands, feet, and face    [place] [facility]/Family Doctors [facility]): [address] Luba BROCKS, [phone]           [facility]: [address], [phone]                [facility]): [address], [phone]  [facility] Department: [address], [place], [phone]    [facility] Pediatricians/Family Doctors [facility]): [address], [phone] [facility]: [address] [place], [phone] [facility]: [address], [phone]  [facility] Doctors  [facility]): [phone] [facility]: [address] [phone]   [facility] Doctors [facility]: [address] [phone]  [facility] Doctors  [facility]: [phone], [phone]  Home Blood Pressure Monitoring for Patients   Your provider has recommended that you check your blood pressure (BP) at least once a week at home. If you do not have a blood pressure cuff at home, one will be provided for you. Contact your provider if you have not received your monitor within 1 week.   Helpful Tips for Accurate Home Blood Pressure Checks  Don't smoke, exercise, or drink caffeine 30 minutes before checking  your BP Use the restroom before checking your BP (a full bladder can raise your pressure) Relax in a comfortable upright chair Feet on the ground Left arm resting comfortably on a flat surface at the level of your heart Legs uncrossed Back supported Sit quietly and don't talk Place the cuff on your bare arm Adjust snuggly, so that only two fingertips can fit between your skin and the top of the cuff Check 2 readings separated by at least one  minute Keep a log of your BP readings For a visual, please reference this diagram: http://ccnc.care/bpdiagram  Provider Name: [facility]     Phone: [phone]  Zone 1: ALL CLEAR  Continue to monitor your symptoms:  BP reading is less than 140 (top number) or less than 90 (bottom number)  No right upper stomach pain No headaches or seeing spots No feeling nauseated or throwing up No swelling in face and hands  Zone 2: CAUTION Call your doctor's office for any of the following:  BP reading is greater than 140 (top number) or greater than 90 (bottom number)  Stomach pain under your ribs in the middle or right side Headaches or seeing spots Feeling nauseated or throwing up Swelling in face and hands  Zone 3: EMERGENCY  Seek immediate medical care if you have any of the following:  BP reading is greater than160 (top number) or greater than 110 (bottom number) Severe headaches not improving with Tylenol  Serious difficulty catching your breath Any worsening symptoms from Zone 2   Second Trimester of Pregnancy The second trimester is from week 13 through week 28, months 4 through 6. The second trimester is often a time when you feel your best. Your body has also adjusted to being pregnant, and you begin to feel better physically. Usually, morning sickness has lessened or quit completely, you may have more energy, and you may have an increase in appetite. The second trimester is also a time when the fetus is growing rapidly. At the end of the sixth month, the fetus is about 9 inches long and weighs about 1 pounds. You will likely begin to feel the baby move (quickening) between 18 and 20 weeks of the pregnancy. BODY CHANGES Your body goes through many changes during pregnancy. The changes vary from woman to woman.  Your weight will continue to increase. You will notice your lower abdomen bulging out. You may begin to get stretch marks on your hips, abdomen, and breasts. You may  develop headaches that can be relieved by medicines approved by your health care provider. You may urinate more often because the fetus is pressing on your bladder. You may develop or continue to have heartburn as a result of your pregnancy. You may develop constipation because certain hormones are causing the muscles that push waste through your intestines to slow down. You may develop hemorrhoids or swollen, bulging veins (varicose veins). You may have back pain because of the weight gain and pregnancy hormones relaxing your joints between the bones in your pelvis and as a result of a shift in weight and the muscles that support your balance. Your breasts will continue to grow and be tender. Your gums may bleed and may be sensitive to brushing and flossing. Dark spots or blotches (chloasma, mask of pregnancy) may develop on your face. This will likely fade after the baby is born. A dark line from your belly button to the pubic area (linea nigra) may appear. This will likely fade after the  baby is born. You may have changes in your hair. These can include thickening of your hair, rapid growth, and changes in texture. Some women also have hair loss during or after pregnancy, or hair that feels dry or thin. Your hair will most likely return to normal after your baby is born. WHAT TO EXPECT AT YOUR PRENATAL VISITS During a routine prenatal visit: You will be weighed to make sure you and the fetus are growing normally. Your blood pressure will be taken. Your abdomen will be measured to track your baby's growth. The fetal heartbeat will be listened to. Any test results from the previous visit will be discussed. Your health care provider may ask you: How you are feeling. If you are feeling the baby move. If you have had any abnormal symptoms, such as leaking fluid, bleeding, severe headaches, or abdominal cramping. If you have any questions. Other tests that may be performed during your second  trimester include: Blood tests that check for: Low iron levels (anemia). Gestational diabetes (between 24 and 28 weeks). Rh antibodies. Urine tests to check for infections, diabetes, or protein in the urine. An ultrasound to confirm the proper growth and development of the baby. An amniocentesis to check for possible genetic problems. Fetal screens for spina bifida and Down syndrome. HOME CARE INSTRUCTIONS  Avoid all smoking, herbs, alcohol, and unprescribed drugs. These chemicals affect the formation and growth of the baby. Follow your health care provider's instructions regarding medicine use. There are medicines that are either safe or unsafe to take during pregnancy. Exercise only as directed by your health care provider. Experiencing uterine cramps is a good sign to stop exercising. Continue to eat regular, healthy meals. Wear a good support bra for breast tenderness. Do not use hot tubs, steam rooms, or saunas. Wear your seat belt at all times when driving. Avoid raw meat, uncooked cheese, cat litter boxes, and soil used by cats. These carry germs that can cause birth defects in the baby. Take your prenatal vitamins. Try taking a stool softener (if your health care provider approves) if you develop constipation. Eat more high-fiber foods, such as fresh vegetables or fruit and whole grains. Drink plenty of fluids to keep your urine clear or pale yellow. Take warm sitz baths to soothe any pain or discomfort caused by hemorrhoids. Use hemorrhoid cream if your health care provider approves. If you develop varicose veins, wear support hose. Elevate your feet for 15 minutes, 3-4 times a day. Limit salt in your diet. Avoid heavy lifting, wear low heel shoes, and practice good posture. Rest with your legs elevated if you have leg cramps or low back pain. Visit your dentist if you have not gone yet during your pregnancy. Use a soft toothbrush to brush your teeth and be gentle when you floss. A  sexual relationship may be continued unless your health care provider directs you otherwise. Continue to go to all your prenatal visits as directed by your health care provider. SEEK MEDICAL CARE IF:  You have dizziness. You have mild pelvic cramps, pelvic pressure, or nagging pain in the abdominal area. You have persistent nausea, vomiting, or diarrhea. You have a bad smelling vaginal discharge. You have pain with urination. SEEK IMMEDIATE MEDICAL CARE IF:  You have a fever. You are leaking fluid from your vagina. You have spotting or bleeding from your vagina. You have severe abdominal cramping or pain. You have rapid weight gain or loss. You have shortness of breath with chest pain. You  notice sudden or extreme swelling of your face, hands, ankles, feet, or legs. You have not felt your baby move in over an hour. You have severe headaches that do not go away with medicine. You have vision changes. Document Released: 08/19/2001 Document Revised: 08/30/2013 Document Reviewed: 10/26/2012 [facility] Patient Information 2015 [place], [place]. This information is not intended to replace advice given to you by your health care provider. Make sure you discuss any questions you have with your health care provider.

## 2024-04-27 NOTE — Progress Notes (Signed)
 HIGH-RISK PREGNANCY VISIT Patient name: Lauren Guzman MRN [id number]  Date of birth: 1996/06/01 Chief Complaint:   Routine Prenatal Visit  History of Present Illness:   Lauren Guzman is a 28 y.o. G6P1001 female at [redacted]w[redacted]d with an Estimated Date of Delivery: 08/07/24 being seen today for ongoing management of a high-risk pregnancy complicated by PG BMI 44.    Today she reports no complaints. Contractions: Not present. Vag. Bleeding: None.  Movement: Present. denies leaking of fluid.      01/26/2024   10:01 AM 11/11/2022    9:18 AM 07/29/2022    2:43 PM  Depression screen PHQ 2/9  Decreased Interest 0 0 0  Down, Depressed, Hopeless 0 0 0  PHQ - 2 Score 0 0 0  Altered sleeping 0 0 0  Tired, decreased energy 0 0 0  Change in appetite 0 0 0  Feeling bad or failure about yourself  0 0 0  Trouble concentrating 0 0 0  Moving slowly or fidgety/restless 0 0 0  Suicidal thoughts 0 0 0  PHQ-9 Score 0 0 0        01/26/2024   10:01 AM 11/11/2022    9:18 AM 07/29/2022    2:43 PM  GAD 7 : Generalized Anxiety Score  Nervous, Anxious, on Edge 0 0 0  Control/stop worrying 0 0 0  Worry too much - different things 0 0 0  Trouble relaxing 0 0 0  Restless 0 0 0  Easily annoyed or irritable 0 0 1  Afraid - awful might happen 0 0 0  Total GAD 7 Score 0 0 1     Review of Systems:   Pertinent items are noted in HPI Denies abnormal vaginal discharge w/ itching/odor/irritation, headaches, visual changes, shortness of breath, chest pain, abdominal pain, severe nausea/vomiting, or problems with urination or bowel movements unless otherwise stated above. Pertinent History Reviewed:  Reviewed past medical,surgical, social, obstetrical and family history.  Reviewed problem list, medications and allergies. Physical Assessment:   Vitals:   04/27/24 1212  BP: 113/75  Pulse: (!) 105  Weight: 241 lb (109.3 kg)  Body mass index is 44.08 kg/m.           Physical Examination:   General appearance:  alert, well appearing, and in no distress  Mental status: alert, oriented to person, place, and time  Skin: warm & dry   Extremities:      Cardiovascular: normal heart rate noted  Respiratory: normal respiratory effort, no distress  Abdomen: gravid, soft, non-tender  Pelvic: Cervical exam deferred         Fetal Status:     Movement: Present    Fetal Surveillance Testing today: US  25+3 wks,cephalic,cx 3.7 cm,anterior placenta gr 1,SVP of fluid 5.7 cm,normal ovaries,FHR 160 bpm,EFW 809 g 34%,anatomy of the hands and spine complete,no obvious abnormalities   Chaperone: N/A  No results found for this or any previous visit (from the past 24 hours).  Assessment & Plan:  High-risk pregnancy: G2P1001 at [redacted]w[redacted]d with an Estimated Date of Delivery: 08/07/24   1) PGBMI 44, stable, EFW today 34%  2) H/O GHTN, ASA  Meds: No orders of the defined types were placed in this encounter.   Labs/procedures today: U/S  Treatment Plan:  U/S q4wks   2x/wk nst or weekly BPP @ 34-36wks   Deliver @ 39-40.6wks   Reviewed: Preterm labor symptoms and general obstetric precautions including but not limited to vaginal bleeding, contractions, leaking of fluid and  fetal movement were reviewed in detail with the patient.  All questions were answered. Does have home bp cuff. Office bp cuff given: not applicable. Check bp weekly, let us  know if consistently >140 and/or >90.  Follow-up: Return in about 3 weeks (around 05/18/2024) for HROB, PN2, US :EFW, CNM, in person.   Future Appointments  Date Time Provider Department Center  04/27/2024  1:30 PM Kizzie Suzen SAUNDERS, CNM [facility]    No orders of the defined types were placed in this encounter.  Suzen SAUNDERS Kizzie CNM, [facility] 04/27/2024 12:40 PM

## 2024-05-17 ENCOUNTER — Other Ambulatory Visit: Payer: Self-pay | Admitting: Women's Health

## 2024-05-17 DIAGNOSIS — O99212 Obesity complicating pregnancy, second trimester: Secondary | ICD-10-CM

## 2024-05-17 DIAGNOSIS — Q998 Other specified chromosome abnormalities: Secondary | ICD-10-CM

## 2024-05-18 ENCOUNTER — Other Ambulatory Visit

## 2024-05-18 ENCOUNTER — Encounter: Payer: Self-pay | Admitting: Advanced Practice Midwife

## 2024-05-18 ENCOUNTER — Ambulatory Visit (INDEPENDENT_AMBULATORY_CARE_PROVIDER_SITE_OTHER): Admitting: Advanced Practice Midwife

## 2024-05-18 VITALS — BP 122/79 | HR 123 | Wt 241.0 lb

## 2024-05-18 DIAGNOSIS — O0990 Supervision of high risk pregnancy, unspecified, unspecified trimester: Secondary | ICD-10-CM

## 2024-05-18 DIAGNOSIS — A749 Chlamydial infection, unspecified: Secondary | ICD-10-CM | POA: Diagnosis not present

## 2024-05-18 DIAGNOSIS — O99212 Obesity complicating pregnancy, second trimester: Secondary | ICD-10-CM | POA: Diagnosis not present

## 2024-05-18 DIAGNOSIS — Q998 Other specified chromosome abnormalities: Secondary | ICD-10-CM

## 2024-05-18 DIAGNOSIS — Z3A28 28 weeks gestation of pregnancy: Secondary | ICD-10-CM

## 2024-05-18 DIAGNOSIS — O0993 Supervision of high risk pregnancy, unspecified, third trimester: Secondary | ICD-10-CM

## 2024-05-18 DIAGNOSIS — Z1389 Encounter for screening for other disorder: Secondary | ICD-10-CM

## 2024-05-18 DIAGNOSIS — Z131 Encounter for screening for diabetes mellitus: Secondary | ICD-10-CM

## 2024-05-18 DIAGNOSIS — Z331 Pregnant state, incidental: Secondary | ICD-10-CM

## 2024-05-18 DIAGNOSIS — O289 Unspecified abnormal findings on antenatal screening of mother: Secondary | ICD-10-CM

## 2024-05-18 LAB — POCT URINALYSIS DIPSTICK OB
Blood, UA: NEGATIVE
Glucose, UA: NEGATIVE
Ketones, UA: NEGATIVE
Nitrite, UA: NEGATIVE

## 2024-05-18 NOTE — Patient Instructions (Signed)
Lauren Guzman, thank you for choosing our office today! We appreciate the opportunity to meet your healthcare needs. You may receive a short survey by mail, e-mail, or through [employer]. If you are happy with your care we would appreciate if you could take just a few minutes to complete the survey questions. We read all of your comments and take your feedback very seriously. Thank you again for choosing our office.  Center for [employer] Team at [place] at [facility] ([place], [place] [address]) Entrance C, located off of [address] parking   CLASSES: Go to [email or web address] to register for classes (childbirth, breastfeeding, waterbirth, infant CPR, daddy bootcamp, etc.)  Call the office [phone]) or go to [facility] if: You begin to have strong, frequent contractions Your water breaks.  Sometimes it is a big gush of fluid, sometimes it is just a trickle that keeps getting your panties wet or running down your legs You have vaginal bleeding.  It is normal to have a small amount of spotting if your cervix was checked.  You don't feel your baby moving like normal.  If you don't, get you something to eat and drink and lay down and focus on feeling your baby move.   If your baby is still not moving like normal, you should call the office or go to [facility].  Call the office [phone]) or go to [facility] hospital for these signs of pre-eclampsia: Severe headache that does not go away with Tylenol Visual changes- seeing spots, double, blurred vision Pain under your right breast or upper abdomen that does not go away with Tums or heartburn medicine Nausea and/or vomiting Severe swelling in your hands, feet, and face   Tdap Vaccine It is recommended that you get the Tdap vaccine during the third trimester of EACH pregnancy to help protect your baby from getting pertussis (whooping cough) 27-36 weeks is the BEST time to do  this so that you can pass the protection on to your baby. During pregnancy is better than after pregnancy, but if you are unable to get it during pregnancy it will be offered at the hospital.  You can get this vaccine with [place], at the health department, your family doctor, or some local pharmacies Everyone who will be around your baby should also be up-to-date on their vaccines before the baby comes. Adults (who are not pregnant) only need 1 dose of Tdap during adulthood.   [facility] Pediatricians/Family Doctors [facility]): [address] [place], [facility]: [address] [place], [phone]                [facility]): [place], [phone] (call to ask if accepting patients) [facility] Department: [address], [place], [place] Pediatricians/Family Doctors [facility]): [place] [place], [address], [place] [facility]: [address] [place], [place] [facility]: [place], [place]): [phone] [facility]: [address], [place]: [address] [facility]: [phone], [phone]  Home Blood Pressure Monitoring for Patients   Your provider has recommended that you check your  blood pressure (BP) at least once a week at home. If you do not have a blood pressure cuff at home, one will be provided for you. Contact your provider if you have not received your monitor within 1 week.   Helpful Tips for Accurate Home Blood Pressure Checks  Don't smoke, exercise, or drink caffeine 30 minutes before checking your BP Use the restroom before checking your BP (a full bladder can raise your  pressure) Relax in a comfortable upright chair Feet on the ground Left arm resting comfortably on a flat surface at the level of your heart Legs uncrossed Back supported Sit quietly and don't talk Place the cuff on your bare arm Adjust snuggly, so that only two fingertips can fit between your skin and the top of the cuff Check 2 readings separated by at least one minute Keep a log of your BP readings For a visual, please reference this diagram: http://ccnc.care/bpdiagram  Provider Name: [facility]     Phone: [phone]  Zone 1: ALL CLEAR  Continue to monitor your symptoms:  BP reading is less than 140 (top number) or less than 90 (bottom number)  No right upper stomach pain No headaches or seeing spots No feeling nauseated or throwing up No swelling in face and hands  Zone 2: CAUTION Call your doctor's office for any of the following:  BP reading is greater than 140 (top number) or greater than 90 (bottom number)  Stomach pain under your ribs in the middle or right side Headaches or seeing spots Feeling nauseated or throwing up Swelling in face and hands  Zone 3: EMERGENCY  Seek immediate medical care if you have any of the following:  BP reading is greater than160 (top number) or greater than 110 (bottom number) Severe headaches not improving with Tylenol Serious difficulty catching your breath Any worsening symptoms from Zone 2   Third Trimester of Pregnancy The third trimester is from week 29 through week 42, months 7 through 9. The third trimester is a time when the fetus is growing rapidly. At the end of the ninth month, the fetus is about 20 inches in length and weighs 6-10 pounds.  BODY CHANGES Your body goes through many changes during pregnancy. The changes vary from woman to woman.  Your weight will continue to increase. You can expect to gain 25-35 pounds (11-16 kg) by the end of the pregnancy. You may begin to get stretch marks on your hips, abdomen,  and breasts. You may urinate more often because the fetus is moving lower into your pelvis and pressing on your bladder. You may develop or continue to have heartburn as a result of your pregnancy. You may develop constipation because certain hormones are causing the muscles that push waste through your intestines to slow down. You may develop hemorrhoids or swollen, bulging veins (varicose veins). You may have pelvic pain because of the weight gain and pregnancy hormones relaxing your joints between the bones in your pelvis. Backaches may result from overexertion of the muscles supporting your posture. You may have changes in your hair. These can include thickening of your hair, rapid growth, and changes in texture. Some women also have hair loss during or after pregnancy, or hair that feels dry or thin. Your hair will most likely return to normal after your baby is born. Your breasts will continue to grow and be tender. A yellow discharge may leak from your breasts called colostrum. Your belly button may stick out. You may   feel short of breath because of your expanding uterus. You may notice the fetus "dropping," or moving lower in your abdomen. You may have a bloody mucus discharge. This usually occurs a few days to a week before labor begins. Your cervix becomes thin and soft (effaced) near your due date. WHAT TO EXPECT AT YOUR PRENATAL EXAMS  You will have prenatal exams every 2 weeks until week 36. Then, you will have weekly prenatal exams. During a routine prenatal visit: You will be weighed to make sure you and the fetus are growing normally. Your blood pressure is taken. Your abdomen will be measured to track your baby's growth. The fetal heartbeat will be listened to. Any test results from the previous visit will be discussed. You may have a cervical check near your due date to see if you have effaced. At around 36 weeks, your caregiver will check your cervix. At the same time, your  caregiver will also perform a test on the secretions of the vaginal tissue. This test is to determine if a type of bacteria, Group B streptococcus, is present. Your caregiver will explain this further. Your caregiver may ask you: What your birth plan is. How you are feeling. If you are feeling the baby move. If you have had any abnormal symptoms, such as leaking fluid, bleeding, severe headaches, or abdominal cramping. If you have any questions. Other tests or screenings that may be performed during your third trimester include: Blood tests that check for low iron levels (anemia). Fetal testing to check the health, activity level, and growth of the fetus. Testing is done if you have certain medical conditions or if there are problems during the pregnancy. FALSE LABOR You may feel small, irregular contractions that eventually go away. These are called Braxton Hicks contractions, or false labor. Contractions may last for hours, days, or even weeks before true labor sets in. If contractions come at regular intervals, intensify, or become painful, it is best to be seen by your caregiver.  SIGNS OF LABOR  Menstrual-like cramps. Contractions that are 5 minutes apart or less. Contractions that start on the top of the uterus and spread down to the lower abdomen and back. A sense of increased pelvic pressure or back pain. A watery or bloody mucus discharge that comes from the vagina. If you have any of these signs before the 37th week of pregnancy, call your caregiver right away. You need to go to the hospital to get checked immediately. HOME CARE INSTRUCTIONS  Avoid all smoking, herbs, alcohol, and unprescribed drugs. These chemicals affect the formation and growth of the baby. Follow your caregiver's instructions regarding medicine use. There are medicines that are either safe or unsafe to take during pregnancy. Exercise only as directed by your caregiver. Experiencing uterine cramps is a good sign to  stop exercising. Continue to eat regular, healthy meals. Wear a good support bra for breast tenderness. Do not use hot tubs, steam rooms, or saunas. Wear your seat belt at all times when driving. Avoid raw meat, uncooked cheese, cat litter boxes, and soil used by cats. These carry germs that can cause birth defects in the baby. Take your prenatal vitamins. Try taking a stool softener (if your caregiver approves) if you develop constipation. Eat more high-fiber foods, such as fresh vegetables or fruit and whole grains. Drink plenty of fluids to keep your urine clear or pale yellow. Take warm sitz baths to soothe any pain or discomfort caused by hemorrhoids. Use hemorrhoid cream if   your caregiver approves. If you develop varicose veins, wear support hose. Elevate your feet for 15 minutes, 3-4 times a day. Limit salt in your diet. Avoid heavy lifting, wear low heal shoes, and practice good posture. Rest a lot with your legs elevated if you have leg cramps or low back pain. Visit your dentist if you have not gone during your pregnancy. Use a soft toothbrush to brush your teeth and be gentle when you floss. A sexual relationship may be continued unless your caregiver directs you otherwise. Do not travel far distances unless it is absolutely necessary and only with the approval of your caregiver. Take prenatal classes to understand, practice, and ask questions about the labor and delivery. Make a trial run to the hospital. Pack your hospital bag. Prepare the baby's nursery. Continue to go to all your prenatal visits as directed by your caregiver. SEEK MEDICAL CARE IF: You are unsure if you are in labor or if your water has broken. You have dizziness. You have mild pelvic cramps, pelvic pressure, or nagging pain in your abdominal area. You have persistent nausea, vomiting, or diarrhea. You have a bad smelling vaginal discharge. You have pain with urination. SEEK IMMEDIATE MEDICAL CARE IF:  You  have a fever. You are leaking fluid from your vagina. You have spotting or bleeding from your vagina. You have severe abdominal cramping or pain. You have rapid weight loss or gain. You have shortness of breath with chest pain. You notice sudden or extreme swelling of your face, hands, ankles, feet, or legs. You have not felt your baby move in over an hour. You have severe headaches that do not go away with medicine. You have vision changes. Document Released: 08/19/2001 Document Revised: 08/30/2013 Document Reviewed: 10/26/2012 [facility] Patient Information 2015 [place], [place]. This information is not intended to replace advice given to you by your health care provider. Make sure you discuss any questions you have with your health care provider.

## 2024-05-18 NOTE — Progress Notes (Signed)
 US  28+3 wks,cephalic,anterior placenta gr 2,normal ovaries,FHR 143 bpm,AFI 14 cm,EFW 1213 g 34%

## 2024-05-18 NOTE — Progress Notes (Signed)
 HIGH-RISK PREGNANCY VISIT Patient name: Lauren Guzman MRN [id number]  Date of birth: November 02, 1995 Chief Complaint:   Routine Prenatal Visit and Pregnancy Ultrasound (PN2 done this morning.tdap information given)  History of Present Illness:   Lauren Guzman is a 28 y.o. 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    Today she reports no complaints. Contractions: Not present.  .  Movement: Present. denies leaking of fluid.      01/26/2024   10:01 AM 11/11/2022    9:18 AM 07/29/2022    2:43 PM  Depression screen PHQ 2/9  Decreased Interest 0 0 0  Down, Depressed, Hopeless 0 0 0  PHQ - 2 Score 0 0 0  Altered sleeping 0 0 0  Tired, decreased energy 0 0 0  Change in appetite 0 0 0  Feeling bad or failure about yourself  0 0 0  Trouble concentrating 0 0 0  Moving slowly or fidgety/restless 0 0 0  Suicidal thoughts 0 0 0  PHQ-9 Score 0 0 0        01/26/2024   10:01 AM 11/11/2022    9:18 AM 07/29/2022    2:43 PM  GAD 7 : Generalized Anxiety Score  Nervous, Anxious, on Edge 0 0 0  Control/stop worrying 0 0 0  Worry too much - different things 0 0 0  Trouble relaxing 0 0 0  Restless 0 0 0  Easily annoyed or irritable 0 0 1  Afraid - awful might happen 0 0 0  Total GAD 7 Score 0 0 1     Review of Systems:   Pertinent items are noted in HPI Denies abnormal vaginal discharge w/ itching/odor/irritation, headaches, visual changes, shortness of breath, chest pain, abdominal pain, severe nausea/vomiting, or problems with urination or bowel movements unless otherwise stated above. Pertinent History Reviewed:  Reviewed past medical,surgical, social, obstetrical and family history.  Reviewed problem list, medications and allergies. Physical Assessment:   Vitals:   05/18/24 1405  BP: 122/79  Pulse: (!) 123  Weight: 241 lb (109.3 kg)  Body  mass index is 44.08 kg/m.           Physical Examination:   General appearance: alert, well appearing, and in no distress  Mental status: alert, oriented to person, place, and time  Skin: warm & dry   Extremities: Edema: None    Cardiovascular: normal heart rate noted  Respiratory: normal respiratory effort, no distress  Abdomen: gravid, soft, non-tender  Pelvic: Cervical exam deferred         Fetal Status: Fetal Heart Rate (bpm): 143 u/s   Movement: Present    Fetal Surveillance Testing today: US  28+3 wks,cephalic,anterior placenta gr 2,normal ovaries,FHR 143 bpm,AFI 14 cm,EFW 1213 g 34%     Results for orders placed or performed in visit on 05/18/24 (from the past 24 hours)  POC Urinalysis Dipstick OB   Collection Time: 05/18/24  2:26 PM  Result Value Ref Range   Color, UA     Clarity, UA     Glucose, UA Negative Negative   Bilirubin, UA     Ketones, UA negative    Spec Grav, UA     Blood, UA negative    pH, UA     POC,PROTEIN,UA Moderate (2+) Negative, Trace, Small (1+), Moderate (2+), Large (3+), 4+   Urobilinogen, UA  Nitrite, UA negative    Leukocytes, UA Large (3+) (A) Negative   Appearance     Odor      Assessment & Plan:  High-risk pregnancy: G2P1001 at [redacted]w[redacted]d with an Estimated Date of Delivery: 08/07/24   1) PG BMI 43, EFW 34% today; will schedule for 32 and 36wks  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   Labs/procedures today: U/S and PN2; Tdap info given  Treatment Plan:  growth q 4wks  Reviewed: Preterm labor symptoms and general obstetric precautions including but not limited to vaginal bleeding, contractions, leaking of fluid and fetal movement were reviewed in detail with the patient.  All questions were answered. Does not have home bp cuff. Office bp cuff given: yes. Check bp daily, let us  know if consistently >140 and/or  >90.  Follow-up: Return for schedule one last u/s for EFW 4wks after the 10/8 u/s and HROB.   Future Appointments  Date Time Provider Department Center  06/01/2024 11:10 AM Kizzie Suzen SAUNDERS, CNM [facility]  06/15/2024 10:00 AM [facility] US  [facility] None  06/15/2024 10:50 AM Kizzie Suzen SAUNDERS, CNM [facility]  07/13/2024 10:00 AM [facility] IMG 2 [facility] None  07/13/2024 10:50 AM Kizzie Suzen SAUNDERS, CNM [facility]    Orders Placed This Encounter  Procedures   POC Urinalysis Dipstick OB   Suzen JONETTA Gentry [facility] 05/18/2024 2:51 PM

## 2024-05-19 LAB — CBC
Hematocrit: 34.5 % (ref 34.0–46.6)
Hemoglobin: 10.9 g/dL — ABNORMAL LOW (ref 11.1–15.9)
MCH: 27.4 pg (ref 26.6–33.0)
MCHC: 31.6 g/dL (ref 31.5–35.7)
MCV: 87 fL (ref 79–97)
Platelets: 250 x10E3/uL (ref 150–450)
RBC: 3.98 x10E6/uL (ref 3.77–5.28)
RDW: 13.2 % (ref 11.7–15.4)
WBC: 9.9 x10E3/uL (ref 3.4–10.8)

## 2024-05-19 LAB — HIV ANTIBODY (ROUTINE TESTING W REFLEX): HIV Screen 4th Generation wRfx: NONREACTIVE

## 2024-05-19 LAB — GLUCOSE TOLERANCE, 2 HOURS W/ 1HR
Glucose, 1 hour: 132 mg/dL (ref 70–179)
Glucose, 2 hour: 107 mg/dL (ref 70–152)
Glucose, Fasting: 87 mg/dL (ref 70–91)

## 2024-05-19 LAB — RPR: RPR Ser Ql: NONREACTIVE

## 2024-05-19 LAB — ANTIBODY SCREEN: Antibody Screen: NEGATIVE

## 2024-05-25 ENCOUNTER — Ambulatory Visit: Payer: Self-pay | Admitting: Advanced Practice Midwife

## 2024-06-01 ENCOUNTER — Encounter: Payer: Self-pay | Admitting: Women's Health

## 2024-06-01 ENCOUNTER — Ambulatory Visit (INDEPENDENT_AMBULATORY_CARE_PROVIDER_SITE_OTHER): Admitting: Women's Health

## 2024-06-01 ENCOUNTER — Other Ambulatory Visit (HOSPITAL_COMMUNITY)
Admission: RE | Admit: 2024-06-01 | Discharge: 2024-06-01 | Disposition: A | Discharge status: Home / Self Care | Source: Ambulatory Visit | Attending: Women's Health | Admitting: Women's Health

## 2024-06-01 VITALS — BP 119/81 | HR 123 | Wt 241.0 lb

## 2024-06-01 DIAGNOSIS — O26853 Spotting complicating pregnancy, third trimester: Secondary | ICD-10-CM

## 2024-06-01 DIAGNOSIS — O0993 Supervision of high risk pregnancy, unspecified, third trimester: Secondary | ICD-10-CM | POA: Diagnosis present

## 2024-06-01 DIAGNOSIS — Z3A00 Weeks of gestation of pregnancy not specified: Secondary | ICD-10-CM | POA: Insufficient documentation

## 2024-06-01 DIAGNOSIS — N898 Other specified noninflammatory disorders of vagina: Secondary | ICD-10-CM | POA: Diagnosis present

## 2024-06-01 DIAGNOSIS — O0990 Supervision of high risk pregnancy, unspecified, unspecified trimester: Secondary | ICD-10-CM

## 2024-06-01 DIAGNOSIS — Z3A30 30 weeks gestation of pregnancy: Secondary | ICD-10-CM | POA: Diagnosis not present

## 2024-06-01 DIAGNOSIS — O26893 Other specified pregnancy related conditions, third trimester: Secondary | ICD-10-CM | POA: Insufficient documentation

## 2024-06-01 DIAGNOSIS — Z23 Encounter for immunization: Secondary | ICD-10-CM

## 2024-06-01 DIAGNOSIS — Z6841 Body Mass Index (BMI) 40.0 and over, adult: Secondary | ICD-10-CM

## 2024-06-01 NOTE — Patient Instructions (Signed)
[place], thank you for choosing our office today! We appreciate the opportunity to meet your healthcare needs. You may receive a short survey by mail, e-mail, or through MyChart. If you are happy with your care we would appreciate if you could take just a few minutes to complete the survey questions. We read all of your comments and take your feedback very seriously. Thank you again for choosing our office.  [facility]  [facility] [place] ([address] [place], [place] [address]) Entrance C, located off of [address] Free 24/7 valet parking   CLASSES: Go to [email or web address] to register for classes (childbirth, breastfeeding, waterbirth, infant CPR, daddy bootcamp, etc.)  Call the office ([phone]) or go to [facility] if: You begin to have strong, frequent contractions Your water breaks.  Sometimes it is a big gush of fluid, sometimes it is just a trickle that keeps getting your panties wet or running down your legs You have vaginal bleeding.  It is normal to have a small amount of spotting if your cervix was checked.  You don't feel your baby moving like normal.  If you don't, get you something to eat and drink and lay down and focus on feeling your baby move.   If your baby is still not moving like normal, you should call the office or go to [facility].  Call the office ([phone]) or go to [facility] for these signs of pre-eclampsia: Severe headache that does not go away with Tylenol Visual changes- seeing spots, double, blurred vision Pain under your right breast or upper abdomen that does not go away with Tums or heartburn medicine Nausea and/or vomiting Severe swelling in your hands, feet, and face   Tdap Vaccine It is recommended that you get the Tdap vaccine during the third trimester of EACH pregnancy to help protect your baby from getting pertussis (whooping cough) 27-36 weeks is the BEST time to do  this so that you can pass the protection on to your baby. During pregnancy is better than after pregnancy, but if you are unable to get it during pregnancy it will be offered at the hospital.  You can get this vaccine with us, at the health department, your family doctor, or some local pharmacies Everyone who will be around your baby should also be up-to-date on their vaccines before the baby comes. Adults (who are not pregnant) only need 1 dose of Tdap during adulthood.   [place] Pediatricians/Family Doctors [facility] ([facility]): [address], [phone]           [facility]: [address], [phone]                [facility] ([facility]): [address], [phone] (call to ask if accepting patients) [facility]: [address], [place], [phone]    [place] Pediatricians/Family Doctors [facility] ([facility]): [address], [phone] [facility]: [address], [phone] [facility]: [address], [phone]  [place] [facility]  [facility] ([facility]): [phone] [facility]: [address], [phone]   [place] Family Doctors [facility]: [address], [phone]  [place] [facility]  [facility]: [address], [phone]  Home Blood Pressure Monitoring for Patients   Your provider has recommended that you check your   blood pressure (BP) at least once a week at home. If you do not have a blood pressure cuff at home, one will be provided for you. Contact your provider if you have not received your monitor within 1 week.   Helpful Tips for Accurate Home Blood Pressure Checks  Don't smoke, exercise, or drink caffeine 30 minutes before checking your BP Use the restroom before checking your BP (a full bladder can raise your  pressure) Relax in a comfortable upright chair Feet on the ground Left arm resting comfortably on a flat surface at the level of your heart Legs uncrossed Back supported Sit quietly and don't talk Place the cuff on your bare arm Adjust snuggly, so that only two fingertips can fit between your skin and the top of the cuff Check 2 readings separated by at least one minute Keep a log of your BP readings For a visual, please reference this diagram: http://ccnc.care/bpdiagram  Provider Name: [facility]     Phone: [phone]  Zone 1: ALL CLEAR  Continue to monitor your symptoms:  BP reading is less than 140 (top number) or less than 90 (bottom number)  No right upper stomach pain No headaches or seeing spots No feeling nauseated or throwing up No swelling in face and hands  Zone 2: CAUTION Call your doctor's office for any of the following:  BP reading is greater than 140 (top number) or greater than 90 (bottom number)  Stomach pain under your ribs in the middle or right side Headaches or seeing spots Feeling nauseated or throwing up Swelling in face and hands  Zone 3: EMERGENCY  Seek immediate medical care if you have any of the following:  BP reading is greater than160 (top number) or greater than 110 (bottom number) Severe headaches not improving with Tylenol Serious difficulty catching your breath Any worsening symptoms from Zone 2  Preterm Labor and Birth Information  The normal length of a pregnancy is 39-41 weeks. Preterm labor is when labor starts before 37 completed weeks of pregnancy. What are the risk factors for preterm labor? Preterm labor is more likely to occur in women who: Have certain infections during pregnancy such as a bladder infection, sexually transmitted infection, or infection inside the uterus (chorioamnionitis). Have a shorter-than-normal cervix. Have gone into preterm labor before. Have had surgery on their cervix. Are younger than age 17  or older than age 35. Are African American. Are pregnant with twins or multiple babies (multiple gestation). Take street drugs or smoke while pregnant. Do not gain enough weight while pregnant. Became pregnant shortly after having been pregnant. What are the symptoms of preterm labor? Symptoms of preterm labor include: Cramps similar to those that can happen during a menstrual period. The cramps may happen with diarrhea. Pain in the abdomen or lower back. Regular uterine contractions that may feel like tightening of the abdomen. A feeling of increased pressure in the pelvis. Increased watery or bloody mucus discharge from the vagina. Water breaking (ruptured amniotic sac). Why is it important to recognize signs of preterm labor? It is important to recognize signs of preterm labor because babies who are born prematurely may not be fully developed. This can put them at an increased risk for: Long-term (chronic) heart and lung problems. Difficulty immediately after birth with regulating body systems, including blood sugar, body temperature, heart rate, and breathing rate. Bleeding in the brain. Cerebral palsy. Learning difficulties. Death. These risks are highest for babies who are born before 34 weeks   of pregnancy. How is preterm labor treated? Treatment depends on the length of your pregnancy, your condition, and the health of your baby. It may involve: Having a stitch (suture) placed in your cervix to prevent your cervix from opening too early (cerclage). Taking or being given medicines, such as: Hormone medicines. These may be given early in pregnancy to help support the pregnancy. Medicine to stop contractions. Medicines to help mature the baby's lungs. These may be prescribed if the risk of delivery is high. Medicines to prevent your baby from developing cerebral palsy. If the labor happens before 34 weeks of pregnancy, you may need to stay in the hospital. What should I do if I  think I am in preterm labor? If you think that you are going into preterm labor, call your health care provider right away. How can I prevent preterm labor in future pregnancies? To increase your chance of having a full-term pregnancy: Do not use any tobacco products, such as cigarettes, chewing tobacco, and e-cigarettes. If you need help quitting, ask your health care provider. Do not use street drugs or medicines that have not been prescribed to you during your pregnancy. Talk with your health care provider before taking any herbal supplements, even if you have been taking them regularly. Make sure you gain a healthy amount of weight during your pregnancy. Watch for infection. If you think that you might have an infection, get it checked right away. Make sure to tell your health care provider if you have gone into preterm labor before. This information is not intended to replace advice given to you by your health care provider. Make sure you discuss any questions you have with your health care provider. Document Revised: 12/17/2018 Document Reviewed: 01/16/2016 Elsevier Patient Education  2020 Elsevier Inc.   

## 2024-06-01 NOTE — Progress Notes (Signed)
 HIGH-RISK PREGNANCY VISIT Patient name: Lauren Guzman MRN [id number]  Date of birth: Jun 14, 1996 Chief Complaint:   Routine Prenatal Visit (Vaginal spotting/odor)  History of Present Illness:   Lauren Guzman is a 28 y.o. 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    Today she reports vaginal d/c and odor last few days, spotting yesterday and this am. Last sex 2wks ago. Some itching. Contractions: Not present. Vag. Bleeding: None.  Movement: Present. denies leaking of fluid.      01/26/2024   10:01 AM 11/11/2022    9:18 AM 07/29/2022    2:43 PM  Depression screen PHQ 2/9  Decreased Interest 0 0 0  Down, Depressed, Hopeless 0 0 0  PHQ - 2 Score 0 0 0  Altered sleeping 0 0 0  Tired, decreased energy 0 0 0  Change in appetite 0 0 0  Feeling bad or failure about yourself  0 0 0  Trouble concentrating 0 0 0  Moving slowly or fidgety/restless 0 0 0  Suicidal thoughts 0 0 0  PHQ-9 Score 0 0 0        01/26/2024   10:01 AM 11/11/2022    9:18 AM 07/29/2022    2:43 PM  GAD 7 : Generalized Anxiety Score  Nervous, Anxious, on Edge 0 0 0  Control/stop worrying 0 0 0  Worry too much - different things 0 0 0  Trouble relaxing 0 0 0  Restless 0 0 0  Easily annoyed or irritable 0 0 1  Afraid - awful might happen 0 0 0  Total GAD 7 Score 0 0 1     Review of Systems:   Pertinent items are noted in HPI Denies abnormal vaginal discharge w/ itching/odor/irritation, headaches, visual changes, shortness of breath, chest pain, abdominal pain, severe nausea/vomiting, or problems with urination or bowel movements unless otherwise stated above. Pertinent History Reviewed:  Reviewed past medical,surgical, social, obstetrical and family history.  Reviewed problem list, medications and allergies. Physical Assessment:   Vitals:   06/01/24 1146  BP: 119/81  Pulse:  (!) 123  Weight: 241 lb (109.3 kg)  Body mass index is 44.08 kg/m.           Physical Examination:   General appearance: alert, well appearing, and in no distress  Mental status: alert, oriented to person, place, and time  Skin: warm & dry   Extremities: Edema: None    Cardiovascular: normal heart rate noted  Respiratory: normal respiratory effort, no distress  Abdomen: gravid, soft, non-tender  Pelvic: copious amount thin yellow malodorous d/c, friable cx, bled when speculum blades and CV swab touched         Fetal Status: Fetal Heart Rate (bpm): 153 Fundal Height: 30 cm Movement: Present    Fetal Surveillance Testing today: dopper   Chaperone: N/A  No results found for this or any previous visit (from the past 24 hours).  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   Labs/procedures  today: spec exam and CV swab, tdap  Treatment Plan:  U/S 32, 36wks   2x/wk nst or weekly BPP @ 36wks   Deliver @ 39-40.6wks   Reviewed: Preterm labor symptoms and general obstetric precautions including but not limited to vaginal bleeding, contractions, leaking of fluid and fetal movement were reviewed in detail with the patient.  All questions were answered. Does have home bp cuff. Office bp cuff given: not applicable. Check bp weekly, let us  know if consistently >140 and/or >90.  Follow-up: Return for As scheduled.   Future Appointments  Date Time Provider Department Center  06/15/2024 10:00 AM [facility] - [facility] US  [facility] None  06/15/2024 10:50 AM Kizzie Suzen SAUNDERS, CNM [facility]  07/13/2024 10:00 AM [facility] IMG 2 [facility]  None  07/13/2024 10:50 AM Kizzie Suzen SAUNDERS, CNM [facility]    Orders Placed This Encounter  Procedures   US  OB Follow Up   Tdap vaccine greater than or equal to 7yo IM   Suzen SAUNDERS Kizzie CNM, [facility] 06/01/2024 12:28 PM

## 2024-06-02 ENCOUNTER — Ambulatory Visit: Payer: Self-pay | Admitting: Women's Health

## 2024-06-02 DIAGNOSIS — A599 Trichomoniasis, unspecified: Secondary | ICD-10-CM | POA: Insufficient documentation

## 2024-06-02 LAB — CERVICOVAGINAL ANCILLARY ONLY
Bacterial Vaginitis (gardnerella): POSITIVE — AB
Candida Glabrata: NEGATIVE
Candida Vaginitis: POSITIVE — AB
Chlamydia: NEGATIVE
Comment: NEGATIVE
Comment: NEGATIVE
Comment: NEGATIVE
Comment: NEGATIVE
Comment: NEGATIVE
Comment: NORMAL
Neisseria Gonorrhea: NEGATIVE
Trichomonas: POSITIVE — AB

## 2024-06-02 MED ORDER — TERCONAZOLE 0.4 % VA CREA: 1.0000 | TOPICAL_CREAM | Freq: Every day | VAGINAL | 0 refills | Status: DC

## 2024-06-02 MED ORDER — METRONIDAZOLE 500 MG PO TABS: 500.0000 mg | ORAL_TABLET | Freq: Two times a day (BID) | ORAL | 0 refills | Status: DC

## 2024-06-15 ENCOUNTER — Encounter: Payer: Self-pay | Admitting: Women's Health

## 2024-06-15 ENCOUNTER — Ambulatory Visit: Admitting: Women's Health

## 2024-06-15 ENCOUNTER — Ambulatory Visit (INDEPENDENT_AMBULATORY_CARE_PROVIDER_SITE_OTHER)

## 2024-06-15 VITALS — BP 110/77 | HR 114 | Wt 240.0 lb

## 2024-06-15 DIAGNOSIS — E669 Obesity, unspecified: Secondary | ICD-10-CM | POA: Diagnosis not present

## 2024-06-15 DIAGNOSIS — O3510X0 Maternal care for (suspected) chromosomal abnormality in fetus, unspecified, not applicable or unspecified: Secondary | ICD-10-CM | POA: Diagnosis not present

## 2024-06-15 DIAGNOSIS — Z6841 Body Mass Index (BMI) 40.0 and over, adult: Secondary | ICD-10-CM | POA: Diagnosis not present

## 2024-06-15 DIAGNOSIS — O99213 Obesity complicating pregnancy, third trimester: Secondary | ICD-10-CM | POA: Diagnosis not present

## 2024-06-15 DIAGNOSIS — Z3A32 32 weeks gestation of pregnancy: Secondary | ICD-10-CM

## 2024-06-15 DIAGNOSIS — O0993 Supervision of high risk pregnancy, unspecified, third trimester: Secondary | ICD-10-CM | POA: Diagnosis not present

## 2024-06-15 DIAGNOSIS — O0990 Supervision of high risk pregnancy, unspecified, unspecified trimester: Secondary | ICD-10-CM

## 2024-06-15 NOTE — Addendum Note (Signed)
 Addended by: KIZZIE IHA R on: 06/15/2024 11:14 AM   Modules accepted: Orders

## 2024-06-15 NOTE — Progress Notes (Signed)
 HIGH-RISK PREGNANCY VISIT Patient name: Lauren Guzman MRN [id number]  Date of birth: 13-May-1996 Chief Complaint:   Routine Prenatal Visit and Pregnancy Ultrasound  History of Present Illness:   Lauren Guzman is a 28 y.o. G11P1001 female at [redacted]w[redacted]d with an Estimated Date of Delivery: 08/07/24 being seen today for ongoing management of a high-risk pregnancy complicated by PG BMI 43.    Today she reports no complaints. Contractions: Not present.  .  Movement: Present. denies leaking of fluid.      01/26/2024   10:01 AM 11/11/2022    9:18 AM 07/29/2022    2:43 PM  Depression screen PHQ 2/9  Decreased Interest 0 0 0  Down, Depressed, Hopeless 0 0 0  PHQ - 2 Score 0 0 0  Altered sleeping 0 0 0  Tired, decreased energy 0 0 0  Change in appetite 0 0 0  Feeling bad or failure about yourself  0 0 0  Trouble concentrating 0 0 0  Moving slowly or fidgety/restless 0 0 0  Suicidal thoughts 0 0 0  PHQ-9 Score 0 0 0        01/26/2024   10:01 AM 11/11/2022    9:18 AM 07/29/2022    2:43 PM  GAD 7 : Generalized Anxiety Score  Nervous, Anxious, on Edge 0 0 0  Control/stop worrying 0 0 0  Worry too much - different things 0 0 0  Trouble relaxing 0 0 0  Restless 0 0 0  Easily annoyed or irritable 0 0 1  Afraid - awful might happen 0 0 0  Total GAD 7 Score 0 0 1     Review of Systems:   Pertinent items are noted in HPI Denies abnormal vaginal discharge w/ itching/odor/irritation, headaches, visual changes, shortness of breath, chest pain, abdominal pain, severe nausea/vomiting, or problems with urination or bowel movements unless otherwise stated above. Pertinent History Reviewed:  Reviewed past medical,surgical, social, obstetrical and family history.  Reviewed problem list, medications and allergies. Physical Assessment:   Vitals:   06/15/24 1048  BP: 110/77  Pulse: (!) 114  Weight: 240 lb (108.9 kg)  Body mass index is 43.9 kg/m.           Physical Examination:   General appearance:  alert, well appearing, and in no distress  Mental status: alert, oriented to person, place, and time  Skin: warm & dry   Extremities: Edema: None    Cardiovascular: normal heart rate noted  Respiratory: normal respiratory effort, no distress  Abdomen: gravid, soft, non-tender  Pelvic: Cervical exam deferred         Fetal Status:     Movement: Present    Fetal Surveillance Testing today: US  32+3 wks,cephalic,anterior placenta gr 3,FHR 153 bpm,AFI 14 cm,FHR 153 bpm,EFW 1878 g 27%   Chaperone: N/A  No results found for this or any previous visit (from the past 24 hours).  Assessment & Plan:  High-risk pregnancy: G2P1001 at [redacted]w[redacted]d with an Estimated Date of Delivery: 08/07/24   1) PGBMI 43, currently 43  2) H/O GHTN, ASA  3) Recent +trich> taking meds, POC 4wks s/p meds  Meds: No orders of the defined types were placed in this encounter.   Labs/procedures today: U/S  Treatment Plan:  EFW q4w    2x/wk nst or weekly BPP @ 36wks   Deliver @ 39-40.6wks   Reviewed: Preterm labor symptoms and general obstetric precautions including but not limited to vaginal bleeding, contractions, leaking of fluid and  fetal movement were reviewed in detail with the patient.  All questions were answered. Does have home bp cuff. Office bp cuff given: not applicable. Check bp weekly, let us  know if consistently >140 and/or >90.  Follow-up: Return in about 2 weeks (around 06/29/2024) for HROB, CNM, in person; add bpp to 11/5 u/s>start 2x/wk NST the following week.   Future Appointments  Date Time Provider Department Center  06/29/2024 11:10 AM Kizzie Suzen SAUNDERS, CNM [facility]  07/13/2024 10:00 AM [facility] IMG 2 [facility] None  07/13/2024 10:50 AM Kizzie Suzen SAUNDERS, CNM [facility]    No orders of the defined types were placed in this encounter.  Suzen SAUNDERS Kizzie CNM, [facility] 06/15/2024 11:13 AM

## 2024-06-15 NOTE — Patient Instructions (Signed)
[place], thank you for choosing our office today! We appreciate the opportunity to meet your healthcare needs. You may receive a short survey by mail, e-mail, or through MyChart. If you are happy with your care we would appreciate if you could take just a few minutes to complete the survey questions. We read all of your comments and take your feedback very seriously. Thank you again for choosing our office.  [facility]  [facility] [place] ([address] [place], [place] [address]) Entrance C, located off of [address] Free 24/7 valet parking   CLASSES: Go to [email or web address] to register for classes (childbirth, breastfeeding, waterbirth, infant CPR, daddy bootcamp, etc.)  Call the office ([phone]) or go to [facility] if: You begin to have strong, frequent contractions Your water breaks.  Sometimes it is a big gush of fluid, sometimes it is just a trickle that keeps getting your panties wet or running down your legs You have vaginal bleeding.  It is normal to have a small amount of spotting if your cervix was checked.  You don't feel your baby moving like normal.  If you don't, get you something to eat and drink and lay down and focus on feeling your baby move.   If your baby is still not moving like normal, you should call the office or go to [facility].  Call the office ([phone]) or go to [facility] for these signs of pre-eclampsia: Severe headache that does not go away with Tylenol Visual changes- seeing spots, double, blurred vision Pain under your right breast or upper abdomen that does not go away with Tums or heartburn medicine Nausea and/or vomiting Severe swelling in your hands, feet, and face   Tdap Vaccine It is recommended that you get the Tdap vaccine during the third trimester of EACH pregnancy to help protect your baby from getting pertussis (whooping cough) 27-36 weeks is the BEST time to do  this so that you can pass the protection on to your baby. During pregnancy is better than after pregnancy, but if you are unable to get it during pregnancy it will be offered at the hospital.  You can get this vaccine with us, at the health department, your family doctor, or some local pharmacies Everyone who will be around your baby should also be up-to-date on their vaccines before the baby comes. Adults (who are not pregnant) only need 1 dose of Tdap during adulthood.   [place] Pediatricians/Family Doctors [facility] ([facility]): [address], [phone]           [facility]: [address], [phone]                [facility] ([facility]): [address], [phone] (call to ask if accepting patients) [facility]: [address], [place], [phone]    [place] Pediatricians/Family Doctors [facility] ([facility]): [address], [phone] [facility]: [address], [phone] [facility]: [address], [phone]  [place] [facility]  [facility] ([facility]): [phone] [facility]: [address], [phone]   [place] Family Doctors [facility]: [address], [phone]  [place] [facility]  [facility]: [address], [phone]  Home Blood Pressure Monitoring for Patients   Your provider has recommended that you check your   blood pressure (BP) at least once a week at home. If you do not have a blood pressure cuff at home, one will be provided for you. Contact your provider if you have not received your monitor within 1 week.   Helpful Tips for Accurate Home Blood Pressure Checks  Don't smoke, exercise, or drink caffeine 30 minutes before checking your BP Use the restroom before checking your BP (a full bladder can raise your  pressure) Relax in a comfortable upright chair Feet on the ground Left arm resting comfortably on a flat surface at the level of your heart Legs uncrossed Back supported Sit quietly and don't talk Place the cuff on your bare arm Adjust snuggly, so that only two fingertips can fit between your skin and the top of the cuff Check 2 readings separated by at least one minute Keep a log of your BP readings For a visual, please reference this diagram: http://ccnc.care/bpdiagram  Provider Name: [facility]     Phone: [phone]  Zone 1: ALL CLEAR  Continue to monitor your symptoms:  BP reading is less than 140 (top number) or less than 90 (bottom number)  No right upper stomach pain No headaches or seeing spots No feeling nauseated or throwing up No swelling in face and hands  Zone 2: CAUTION Call your doctor's office for any of the following:  BP reading is greater than 140 (top number) or greater than 90 (bottom number)  Stomach pain under your ribs in the middle or right side Headaches or seeing spots Feeling nauseated or throwing up Swelling in face and hands  Zone 3: EMERGENCY  Seek immediate medical care if you have any of the following:  BP reading is greater than160 (top number) or greater than 110 (bottom number) Severe headaches not improving with Tylenol Serious difficulty catching your breath Any worsening symptoms from Zone 2  Preterm Labor and Birth Information  The normal length of a pregnancy is 39-41 weeks. Preterm labor is when labor starts before 37 completed weeks of pregnancy. What are the risk factors for preterm labor? Preterm labor is more likely to occur in women who: Have certain infections during pregnancy such as a bladder infection, sexually transmitted infection, or infection inside the uterus (chorioamnionitis). Have a shorter-than-normal cervix. Have gone into preterm labor before. Have had surgery on their cervix. Are younger than age 17  or older than age 35. Are African American. Are pregnant with twins or multiple babies (multiple gestation). Take street drugs or smoke while pregnant. Do not gain enough weight while pregnant. Became pregnant shortly after having been pregnant. What are the symptoms of preterm labor? Symptoms of preterm labor include: Cramps similar to those that can happen during a menstrual period. The cramps may happen with diarrhea. Pain in the abdomen or lower back. Regular uterine contractions that may feel like tightening of the abdomen. A feeling of increased pressure in the pelvis. Increased watery or bloody mucus discharge from the vagina. Water breaking (ruptured amniotic sac). Why is it important to recognize signs of preterm labor? It is important to recognize signs of preterm labor because babies who are born prematurely may not be fully developed. This can put them at an increased risk for: Long-term (chronic) heart and lung problems. Difficulty immediately after birth with regulating body systems, including blood sugar, body temperature, heart rate, and breathing rate. Bleeding in the brain. Cerebral palsy. Learning difficulties. Death. These risks are highest for babies who are born before 34 weeks   of pregnancy. How is preterm labor treated? Treatment depends on the length of your pregnancy, your condition, and the health of your baby. It may involve: Having a stitch (suture) placed in your cervix to prevent your cervix from opening too early (cerclage). Taking or being given medicines, such as: Hormone medicines. These may be given early in pregnancy to help support the pregnancy. Medicine to stop contractions. Medicines to help mature the baby's lungs. These may be prescribed if the risk of delivery is high. Medicines to prevent your baby from developing cerebral palsy. If the labor happens before 34 weeks of pregnancy, you may need to stay in the hospital. What should I do if I  think I am in preterm labor? If you think that you are going into preterm labor, call your health care provider right away. How can I prevent preterm labor in future pregnancies? To increase your chance of having a full-term pregnancy: Do not use any tobacco products, such as cigarettes, chewing tobacco, and e-cigarettes. If you need help quitting, ask your health care provider. Do not use street drugs or medicines that have not been prescribed to you during your pregnancy. Talk with your health care provider before taking any herbal supplements, even if you have been taking them regularly. Make sure you gain a healthy amount of weight during your pregnancy. Watch for infection. If you think that you might have an infection, get it checked right away. Make sure to tell your health care provider if you have gone into preterm labor before. This information is not intended to replace advice given to you by your health care provider. Make sure you discuss any questions you have with your health care provider. Document Revised: 12/17/2018 Document Reviewed: 01/16/2016 Elsevier Patient Education  2020 Elsevier Inc.   

## 2024-06-15 NOTE — Progress Notes (Signed)
 US  32+3 wks,cephalic,anterior placenta gr 3,FHR 153 bpm,AFI 14 cm,FHR 153 bpm,EFW 1878 g 27%

## 2024-06-29 ENCOUNTER — Ambulatory Visit (INDEPENDENT_AMBULATORY_CARE_PROVIDER_SITE_OTHER): Admitting: Women's Health

## 2024-06-29 ENCOUNTER — Encounter: Payer: Self-pay | Admitting: Women's Health

## 2024-06-29 ENCOUNTER — Other Ambulatory Visit (HOSPITAL_COMMUNITY)
Admission: RE | Admit: 2024-06-29 | Discharge: 2024-06-29 | Disposition: A | Source: Ambulatory Visit | Attending: Women's Health | Admitting: Women's Health

## 2024-06-29 VITALS — BP 126/83 | HR 65 | Wt 240.4 lb

## 2024-06-29 DIAGNOSIS — A599 Trichomoniasis, unspecified: Secondary | ICD-10-CM

## 2024-06-29 DIAGNOSIS — Z3A34 34 weeks gestation of pregnancy: Secondary | ICD-10-CM

## 2024-06-29 DIAGNOSIS — Z6841 Body Mass Index (BMI) 40.0 and over, adult: Secondary | ICD-10-CM | POA: Diagnosis not present

## 2024-06-29 DIAGNOSIS — O0993 Supervision of high risk pregnancy, unspecified, third trimester: Secondary | ICD-10-CM | POA: Diagnosis present

## 2024-06-29 DIAGNOSIS — O0990 Supervision of high risk pregnancy, unspecified, unspecified trimester: Secondary | ICD-10-CM

## 2024-06-29 NOTE — Patient Instructions (Signed)
[place], thank you for choosing our office today! We appreciate the opportunity to meet your healthcare needs. You may receive a short survey by mail, e-mail, or through MyChart. If you are happy with your care we would appreciate if you could take just a few minutes to complete the survey questions. We read all of your comments and take your feedback very seriously. Thank you again for choosing our office.  [facility]  [facility] [place] ([address] [place], [place] [address]) Entrance C, located off of [address] Free 24/7 valet parking   CLASSES: Go to [email or web address] to register for classes (childbirth, breastfeeding, waterbirth, infant CPR, daddy bootcamp, etc.)  Call the office ([phone]) or go to [facility] if: You begin to have strong, frequent contractions Your water breaks.  Sometimes it is a big gush of fluid, sometimes it is just a trickle that keeps getting your panties wet or running down your legs You have vaginal bleeding.  It is normal to have a small amount of spotting if your cervix was checked.  You don't feel your baby moving like normal.  If you don't, get you something to eat and drink and lay down and focus on feeling your baby move.   If your baby is still not moving like normal, you should call the office or go to [facility].  Call the office ([phone]) or go to [facility] for these signs of pre-eclampsia: Severe headache that does not go away with Tylenol Visual changes- seeing spots, double, blurred vision Pain under your right breast or upper abdomen that does not go away with Tums or heartburn medicine Nausea and/or vomiting Severe swelling in your hands, feet, and face   Tdap Vaccine It is recommended that you get the Tdap vaccine during the third trimester of EACH pregnancy to help protect your baby from getting pertussis (whooping cough) 27-36 weeks is the BEST time to do  this so that you can pass the protection on to your baby. During pregnancy is better than after pregnancy, but if you are unable to get it during pregnancy it will be offered at the hospital.  You can get this vaccine with us, at the health department, your family doctor, or some local pharmacies Everyone who will be around your baby should also be up-to-date on their vaccines before the baby comes. Adults (who are not pregnant) only need 1 dose of Tdap during adulthood.   [place] Pediatricians/Family Doctors [facility] ([facility]): [address], [phone]           [facility]: [address], [phone]                [facility] ([facility]): [address], [phone] (call to ask if accepting patients) [facility]: [address], [place], [phone]    [place] Pediatricians/Family Doctors [facility] ([facility]): [address], [phone] [facility]: [address], [phone] [facility]: [address], [phone]  [place] [facility]  [facility] ([facility]): [phone] [facility]: [address], [phone]   [place] Family Doctors [facility]: [address], [phone]  [place] [facility]  [facility]: [address], [phone]  Home Blood Pressure Monitoring for Patients   Your provider has recommended that you check your   blood pressure (BP) at least once a week at home. If you do not have a blood pressure cuff at home, one will be provided for you. Contact your provider if you have not received your monitor within 1 week.   Helpful Tips for Accurate Home Blood Pressure Checks  Don't smoke, exercise, or drink caffeine 30 minutes before checking your BP Use the restroom before checking your BP (a full bladder can raise your  pressure) Relax in a comfortable upright chair Feet on the ground Left arm resting comfortably on a flat surface at the level of your heart Legs uncrossed Back supported Sit quietly and don't talk Place the cuff on your bare arm Adjust snuggly, so that only two fingertips can fit between your skin and the top of the cuff Check 2 readings separated by at least one minute Keep a log of your BP readings For a visual, please reference this diagram: http://ccnc.care/bpdiagram  Provider Name: [facility]     Phone: [phone]  Zone 1: ALL CLEAR  Continue to monitor your symptoms:  BP reading is less than 140 (top number) or less than 90 (bottom number)  No right upper stomach pain No headaches or seeing spots No feeling nauseated or throwing up No swelling in face and hands  Zone 2: CAUTION Call your doctor's office for any of the following:  BP reading is greater than 140 (top number) or greater than 90 (bottom number)  Stomach pain under your ribs in the middle or right side Headaches or seeing spots Feeling nauseated or throwing up Swelling in face and hands  Zone 3: EMERGENCY  Seek immediate medical care if you have any of the following:  BP reading is greater than160 (top number) or greater than 110 (bottom number) Severe headaches not improving with Tylenol Serious difficulty catching your breath Any worsening symptoms from Zone 2  Preterm Labor and Birth Information  The normal length of a pregnancy is 39-41 weeks. Preterm labor is when labor starts before 37 completed weeks of pregnancy. What are the risk factors for preterm labor? Preterm labor is more likely to occur in women who: Have certain infections during pregnancy such as a bladder infection, sexually transmitted infection, or infection inside the uterus (chorioamnionitis). Have a shorter-than-normal cervix. Have gone into preterm labor before. Have had surgery on their cervix. Are younger than age 17  or older than age 35. Are African American. Are pregnant with twins or multiple babies (multiple gestation). Take street drugs or smoke while pregnant. Do not gain enough weight while pregnant. Became pregnant shortly after having been pregnant. What are the symptoms of preterm labor? Symptoms of preterm labor include: Cramps similar to those that can happen during a menstrual period. The cramps may happen with diarrhea. Pain in the abdomen or lower back. Regular uterine contractions that may feel like tightening of the abdomen. A feeling of increased pressure in the pelvis. Increased watery or bloody mucus discharge from the vagina. Water breaking (ruptured amniotic sac). Why is it important to recognize signs of preterm labor? It is important to recognize signs of preterm labor because babies who are born prematurely may not be fully developed. This can put them at an increased risk for: Long-term (chronic) heart and lung problems. Difficulty immediately after birth with regulating body systems, including blood sugar, body temperature, heart rate, and breathing rate. Bleeding in the brain. Cerebral palsy. Learning difficulties. Death. These risks are highest for babies who are born before 34 weeks   of pregnancy. How is preterm labor treated? Treatment depends on the length of your pregnancy, your condition, and the health of your baby. It may involve: Having a stitch (suture) placed in your cervix to prevent your cervix from opening too early (cerclage). Taking or being given medicines, such as: Hormone medicines. These may be given early in pregnancy to help support the pregnancy. Medicine to stop contractions. Medicines to help mature the baby's lungs. These may be prescribed if the risk of delivery is high. Medicines to prevent your baby from developing cerebral palsy. If the labor happens before 34 weeks of pregnancy, you may need to stay in the hospital. What should I do if I  think I am in preterm labor? If you think that you are going into preterm labor, call your health care provider right away. How can I prevent preterm labor in future pregnancies? To increase your chance of having a full-term pregnancy: Do not use any tobacco products, such as cigarettes, chewing tobacco, and e-cigarettes. If you need help quitting, ask your health care provider. Do not use street drugs or medicines that have not been prescribed to you during your pregnancy. Talk with your health care provider before taking any herbal supplements, even if you have been taking them regularly. Make sure you gain a healthy amount of weight during your pregnancy. Watch for infection. If you think that you might have an infection, get it checked right away. Make sure to tell your health care provider if you have gone into preterm labor before. This information is not intended to replace advice given to you by your health care provider. Make sure you discuss any questions you have with your health care provider. Document Revised: 12/17/2018 Document Reviewed: 01/16/2016 Elsevier Patient Education  2020 Elsevier Inc.   

## 2024-06-29 NOTE — Progress Notes (Signed)
 HIGH-RISK PREGNANCY VISIT Patient name: Lauren Guzman MRN [id number]  Date of birth: 03-10-1996 Chief Complaint:   Routine Prenatal Visit (Aptima  swab proof cure trich)  History of Present Illness:   Lauren Guzman is a 28 y.o. 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    Today she reports lightning crotch and occasional contractions. Contractions: Irregular.  .  Movement: Present. denies leaking of fluid.      01/26/2024   10:01 AM 11/11/2022    9:18 AM 07/29/2022    2:43 PM  Depression screen PHQ 2/9  Decreased Interest 0 0 0  Down, Depressed, Hopeless 0 0 0  PHQ - 2 Score 0 0 0  Altered sleeping 0 0 0  Tired, decreased energy 0 0 0  Change in appetite 0 0 0  Feeling bad or failure about yourself  0 0 0  Trouble concentrating 0 0 0  Moving slowly or fidgety/restless 0 0 0  Suicidal thoughts 0 0 0  PHQ-9 Score 0 0 0        01/26/2024   10:01 AM 11/11/2022    9:18 AM 07/29/2022    2:43 PM  GAD 7 : Generalized Anxiety Score  Nervous, Anxious, on Edge 0 0 0  Control/stop worrying 0 0 0  Worry too much - different things 0 0 0  Trouble relaxing 0 0 0  Restless 0 0 0  Easily annoyed or irritable 0 0 1  Afraid - awful might happen 0 0 0  Total GAD 7 Score 0 0 1     Review of Systems:   Pertinent items are noted in HPI Denies abnormal vaginal discharge w/ itching/odor/irritation, headaches, visual changes, shortness of breath, chest pain, abdominal pain, severe nausea/vomiting, or problems with urination or bowel movements unless otherwise stated above. Pertinent History Reviewed:  Reviewed past medical,surgical, social, obstetrical and family history.  Reviewed problem list, medications and allergies. Physical Assessment:   Vitals:   06/29/24 1115  BP: 126/83  Pulse: 65  Weight: 240 lb 6.4 oz (109 kg)  Body mass index  is 43.97 kg/m.           Physical Examination:   General appearance: alert, well appearing, and in no distress  Mental status: alert, oriented to person, place, and time  Skin: warm & dry   Extremities: Edema: None    Cardiovascular: normal heart rate noted  Respiratory: normal respiratory effort, no distress  Abdomen: gravid, soft, non-tender  Pelvic: blind CV swab collected         Fetal Status: Fetal Heart Rate (bpm): 142 Fundal Height: 34 cm Movement: Present    Fetal Surveillance Testing today: doppler   Chaperone: Peggy Dones  No results found for this or any previous visit (from the past 24 hours).  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   Labs/procedures today: CV swab  Treatment Plan:  EFW q4w   2x/wk nst or weekly BPP @ 34-36wks   Deliver @ 39-40.6wks   Reviewed: Preterm labor symptoms and general obstetric precautions including but not limited  to vaginal bleeding, contractions, leaking of fluid and fetal movement were reviewed in detail with the patient.  All questions were answered. Does have home bp cuff. Office bp cuff given: not applicable. Check bp weekly, let us  know if consistently >140 and/or >90.  Follow-up: Return for As scheduled (make sure 11/5 u/s bpp and efw please).   Future Appointments  Date Time Provider Department Center  07/13/2024 10:00 AM [facility] - FT IMG 2 [facility] None  07/13/2024 10:50 AM Kizzie Suzen SAUNDERS, CNM [facility]  07/18/2024  9:50 AM [facility] NURSE [facility]  07/21/2024  8:50 AM [facility] NURSE [facility]  07/21/2024  9:10 AM Kizzie Suzen SAUNDERS, CNM [facility]  07/25/2024 10:30 AM [facility] NURSE [facility]  07/28/2024 10:30 AM  [facility] NURSE [facility]  07/28/2024 10:50 AM Newton Mering, CNM [facility]  08/01/2024  9:30 AM [facility] NURSE [facility]  08/01/2024  9:50 AM Kizzie Suzen SAUNDERS, CNM [facility]  08/08/2024 10:30 AM [facility] NURSE [facility]  08/08/2024 10:50 AM Kizzie Suzen SAUNDERS, CNM [facility]    No orders of the defined types were placed in this encounter.  Suzen SAUNDERS Kizzie CNM, [facility] 06/29/2024 12:02 PM

## 2024-06-30 ENCOUNTER — Ambulatory Visit: Payer: Self-pay | Admitting: Women's Health

## 2024-06-30 DIAGNOSIS — A599 Trichomoniasis, unspecified: Secondary | ICD-10-CM

## 2024-06-30 LAB — CERVICOVAGINAL ANCILLARY ONLY
Bacterial Vaginitis (gardnerella): POSITIVE — AB
Candida Glabrata: NEGATIVE
Candida Vaginitis: POSITIVE — AB
Comment: NEGATIVE
Comment: NEGATIVE
Comment: NEGATIVE
Comment: NEGATIVE
Trichomonas: NEGATIVE

## 2024-06-30 MED ORDER — MICONAZOLE NITRATE 2 % VA CREA: 1.0000 | TOPICAL_CREAM | Freq: Every day | VAGINAL | 0 refills | Status: DC

## 2024-06-30 MED ORDER — METRONIDAZOLE 500 MG PO TABS: 500.0000 mg | ORAL_TABLET | Freq: Two times a day (BID) | ORAL | 0 refills | Status: DC

## 2024-07-05 ENCOUNTER — Encounter (HOSPITAL_COMMUNITY): Payer: Self-pay

## 2024-07-05 ENCOUNTER — Emergency Department (HOSPITAL_COMMUNITY)
Admission: EM | Admit: 2024-07-05 | Discharge: 2024-07-05 | Disposition: A | Discharge status: Home / Self Care | Attending: Emergency Medicine | Admitting: Emergency Medicine

## 2024-07-05 ENCOUNTER — Other Ambulatory Visit: Payer: Self-pay

## 2024-07-05 DIAGNOSIS — L0231 Cutaneous abscess of buttock: Secondary | ICD-10-CM | POA: Diagnosis not present

## 2024-07-05 DIAGNOSIS — R000 Tachycardia, unspecified: Secondary | ICD-10-CM | POA: Diagnosis not present

## 2024-07-05 DIAGNOSIS — O99891 Other specified diseases and conditions complicating pregnancy: Secondary | ICD-10-CM | POA: Insufficient documentation

## 2024-07-05 MED ORDER — LIDOCAINE-EPINEPHRINE (PF) 2 %-1:200000 IJ SOLN
20.0000 mL | Freq: Once | INTRAMUSCULAR | Status: AC
  Administered 2024-07-05: 20 mL
  Filled 2024-07-05: qty 20

## 2024-07-05 NOTE — ED Provider Notes (Signed)
  EMERGENCY DEPARTMENT AT [facility] Provider Note   CSN: [id number] Arrival date & time: 07/05/24  1540     Patient presents with: Abscess   Lauren Guzman is a 28 y.o. female.  She is here with a complaint of an abscess on her buttock that started a few days ago.  She said has been getting progressively larger.  History of same.  No fevers nausea vomiting.  She is [redacted] weeks pregnant no complications of pregnancy.  No vaginal bleeding or gush of fluid.  Positive fetal movement.  {Add pertinent medical, surgical, social history, OB history to YEP:67052} The history is provided by the patient.  Abscess Location:  Pelvis Pelvic abscess location:  Gluteal cleft Abscess quality: fluctuance and painful   Red streaking: no   Duration:  3 days Progression:  Worsening Pain details:    Quality:  Throbbing   Severity:  Moderate   Progression:  Worsening Chronicity:  Recurrent Relieved by:  Nothing Ineffective treatments:  Warm compresses Associated symptoms: no fever, no nausea and no vomiting        Prior to Admission medications   Medication Sig Start Date End Date Taking? Authorizing Provider  aspirin  EC 81 MG tablet Take 2 tablets (162 mg total) by mouth daily. Swallow whole. 01/26/24   Kizzie Suzen SAUNDERS, CNM  metroNIDAZOLE  (FLAGYL ) 500 MG tablet Take 1 tablet (500 mg total) by mouth 2 (two) times daily. 06/30/24   Kizzie Suzen SAUNDERS, CNM  miconazole (MONISTAT 7) 2 % vaginal cream Place 1 Applicatorful vaginally at bedtime. X 7 nights, no sex while using 06/30/24   Kizzie Suzen SAUNDERS, CNM  Prenatal Vit-Fe Fumarate-FA (PRENATAL VITAMIN PO) Take by mouth.    [provider]    Allergies: Kiwi extract    Review of Systems  Constitutional:  Negative for fever.  Gastrointestinal:  Negative for nausea and vomiting.    Updated Vital Signs BP 129/69   Pulse (!) 137   Temp 98.6 F (37 C) (Oral)   Resp 17   Ht 5' 2 (1.575 m)   Wt 109 kg   LMP  (LMP  Unknown)   SpO2 98%   BMI 43.97 kg/m   Physical Exam Constitutional:      Appearance: Normal appearance. She is well-developed.  HENT:     Head: Normocephalic and atraumatic.  Eyes:     Conjunctiva/sclera: Conjunctivae normal.  Cardiovascular:     Rate and Rhythm: Regular rhythm. Tachycardia present.     Pulses: Normal pulses.  Pulmonary:     Effort: Pulmonary effort is normal. No respiratory distress.  Abdominal:     Comments: gravid  Genitourinary:    Comments: She has approximately 4 cm fluctuant tender area on her left buttock about 2/3 down her intergluteal cleft.  Does not approach the anus. Musculoskeletal:        General: No deformity.     Cervical back: Neck supple.  Skin:    General: Skin is warm and dry.  Neurological:     General: No focal deficit present.     Mental Status: She is alert.     GCS: GCS eye subscore is 4. GCS verbal subscore is 5. GCS motor subscore is 6.     (all labs ordered are listed, but only abnormal results are displayed) Labs Reviewed - No data to display  EKG: None  Radiology: No results found.  {Document cardiac monitor, telemetry assessment procedure when appropriate:32947} .Incision and Drainage  Date/Time: 07/05/2024  4:59 PM  Performed by: Towana Ozell BROCKS, MD Authorized by: Towana Ozell BROCKS, MD   Consent:    Consent obtained:  Verbal   Consent given by:  Patient   Risks discussed:  Bleeding, incomplete drainage, pain and infection   Alternatives discussed:  No treatment, delayed treatment and referral Universal protocol:    Procedure explained and questions answered to patient or proxy's satisfaction: yes     Patient identity confirmed:  Verbally with patient Location:    Type:  Abscess   Size:  4   Location:  Anogenital   Anogenital location:  Gluteal cleft Pre-procedure details:    Skin preparation:  Povidone-iodine Sedation:    Sedation type:  None Anesthesia:    Anesthesia method:  Local infiltration    Local anesthetic:  Lidocaine  2% WITH epi Procedure type:    Complexity:  Complex Procedure details:    Ultrasound guidance: no     Incision types:  Single straight   Drainage:  Purulent   Drainage amount:  Moderate   Wound treatment:  Wound left open Post-procedure details:    Procedure completion:  Tolerated well, no immediate complications    Medications Ordered in the ED  lidocaine -EPINEPHrine  (XYLOCAINE  W/EPI) 2 %-1:200000 (PF) injection 20 mL (has no administration in time range)      {Click here for ABCD2, HEART and other calculators REFRESH Note before signing:1}                              Medical Decision Making Risk Prescription drug management.   This patient complains of ***; this involves an extensive number of treatment Options and is a complaint that carries with it a high risk of complications and morbidity. The differential includes ***  I ordered, reviewed and interpreted labs, which included *** I ordered medication *** and reviewed PMP when indicated. I ordered imaging studies which included *** and I independently    visualized and interpreted imaging which showed *** Additional history obtained from *** Previous records obtained and reviewed *** I consulted *** and discussed lab and imaging findings and discussed disposition.  Cardiac monitoring reviewed, *** Social determinants considered, *** Critical Interventions: ***  After the interventions stated above, I reevaluated the patient and found *** Admission and further testing considered, ***   {Document critical care time when appropriate  Document review of labs and clinical decision tools ie CHADS2VASC2, etc  Document your independent review of radiology images and any outside records  Document your discussion with family members, caretakers and with consultants  Document social determinants of health affecting pt's care  Document your decision making why or why not admission, treatments  were needed:32947:::1}   Final diagnoses:  None    ED Discharge Orders     None

## 2024-07-05 NOTE — ED Triage Notes (Signed)
 Pt arrived via POV c/o abscess on her gluteal cleft X 2 days. Pt reports she only gets these when she is pregnant and her EDD is Nov. 30th.

## 2024-07-05 NOTE — Discharge Instructions (Addendum)
 Tylenol  for pain.  Warm water soaks.  Return to the emergency department if any worsening or concerning symptoms

## 2024-07-13 ENCOUNTER — Ambulatory Visit (INDEPENDENT_AMBULATORY_CARE_PROVIDER_SITE_OTHER): Admitting: Radiology

## 2024-07-13 ENCOUNTER — Ambulatory Visit (INDEPENDENT_AMBULATORY_CARE_PROVIDER_SITE_OTHER): Admitting: Women's Health

## 2024-07-13 ENCOUNTER — Encounter: Payer: Self-pay | Admitting: Women's Health

## 2024-07-13 ENCOUNTER — Other Ambulatory Visit (HOSPITAL_COMMUNITY)
Admission: RE | Admit: 2024-07-13 | Discharge: 2024-07-13 | Disposition: A | Discharge status: Home / Self Care | Source: Ambulatory Visit | Attending: Women's Health | Admitting: Women's Health

## 2024-07-13 VITALS — BP 128/84 | HR 111 | Wt 247.0 lb

## 2024-07-13 DIAGNOSIS — Z8759 Personal history of other complications of pregnancy, childbirth and the puerperium: Secondary | ICD-10-CM

## 2024-07-13 DIAGNOSIS — Z6841 Body Mass Index (BMI) 40.0 and over, adult: Secondary | ICD-10-CM

## 2024-07-13 DIAGNOSIS — Z3A36 36 weeks gestation of pregnancy: Secondary | ICD-10-CM

## 2024-07-13 DIAGNOSIS — O285 Abnormal chromosomal and genetic finding on antenatal screening of mother: Secondary | ICD-10-CM | POA: Diagnosis not present

## 2024-07-13 DIAGNOSIS — O0990 Supervision of high risk pregnancy, unspecified, unspecified trimester: Secondary | ICD-10-CM

## 2024-07-13 DIAGNOSIS — O0993 Supervision of high risk pregnancy, unspecified, third trimester: Secondary | ICD-10-CM

## 2024-07-13 DIAGNOSIS — Z3483 Encounter for supervision of other normal pregnancy, third trimester: Secondary | ICD-10-CM | POA: Diagnosis present

## 2024-07-13 DIAGNOSIS — O09293 Supervision of pregnancy with other poor reproductive or obstetric history, third trimester: Secondary | ICD-10-CM | POA: Diagnosis not present

## 2024-07-13 NOTE — Patient Instructions (Signed)
[place], thank you for choosing our office today! We appreciate the opportunity to meet your healthcare needs. You may receive a short survey by mail, e-mail, or through MyChart. If you are happy with your care we would appreciate if you could take just a few minutes to complete the survey questions. We read all of your comments and take your feedback very seriously. Thank you again for choosing our office.  [facility]  [facility] [place] ([address] [place], [place] [address]) Entrance C, located off of [address] Free 24/7 valet parking   CLASSES: Go to [email or web address] to register for classes (childbirth, breastfeeding, waterbirth, infant CPR, daddy bootcamp, etc.)  Call the office ([phone]) or go to [facility] if: You begin to have strong, frequent contractions Your water breaks.  Sometimes it is a big gush of fluid, sometimes it is just a trickle that keeps getting your panties wet or running down your legs You have vaginal bleeding.  It is normal to have a small amount of spotting if your cervix was checked.  You don't feel your baby moving like normal.  If you don't, get you something to eat and drink and lay down and focus on feeling your baby move.   If your baby is still not moving like normal, you should call the office or go to [facility].  Call the office ([phone]) or go to [facility] for these signs of pre-eclampsia: Severe headache that does not go away with Tylenol Visual changes- seeing spots, double, blurred vision Pain under your right breast or upper abdomen that does not go away with Tums or heartburn medicine Nausea and/or vomiting Severe swelling in your hands, feet, and face   Tdap Vaccine It is recommended that you get the Tdap vaccine during the third trimester of EACH pregnancy to help protect your baby from getting pertussis (whooping cough) 27-36 weeks is the BEST time to do  this so that you can pass the protection on to your baby. During pregnancy is better than after pregnancy, but if you are unable to get it during pregnancy it will be offered at the hospital.  You can get this vaccine with us, at the health department, your family doctor, or some local pharmacies Everyone who will be around your baby should also be up-to-date on their vaccines before the baby comes. Adults (who are not pregnant) only need 1 dose of Tdap during adulthood.   [place] Pediatricians/Family Doctors [facility] ([facility]): [address], [phone]           [facility]: [address], [phone]                [facility] ([facility]): [address], [phone] (call to ask if accepting patients) [facility]: [address], [place], [phone]    [place] Pediatricians/Family Doctors [facility] ([facility]): [address], [phone] [facility]: [address], [phone] [facility]: [address], [phone]  [place] [facility]  [facility] ([facility]): [phone] [facility]: [address], [phone]   [place] Family Doctors [facility]: [address], [phone]  [place] [facility]  [facility]: [address], [phone]  Home Blood Pressure Monitoring for Patients   Your provider has recommended that you check your   blood pressure (BP) at least once a week at home. If you do not have a blood pressure cuff at home, one will be provided for you. Contact your provider if you have not received your monitor within 1 week.   Helpful Tips for Accurate Home Blood Pressure Checks  Don't smoke, exercise, or drink caffeine 30 minutes before checking your BP Use the restroom before checking your BP (a full bladder can raise your  pressure) Relax in a comfortable upright chair Feet on the ground Left arm resting comfortably on a flat surface at the level of your heart Legs uncrossed Back supported Sit quietly and don't talk Place the cuff on your bare arm Adjust snuggly, so that only two fingertips can fit between your skin and the top of the cuff Check 2 readings separated by at least one minute Keep a log of your BP readings For a visual, please reference this diagram: http://ccnc.care/bpdiagram  Provider Name: [facility]     Phone: [phone]  Zone 1: ALL CLEAR  Continue to monitor your symptoms:  BP reading is less than 140 (top number) or less than 90 (bottom number)  No right upper stomach pain No headaches or seeing spots No feeling nauseated or throwing up No swelling in face and hands  Zone 2: CAUTION Call your doctor's office for any of the following:  BP reading is greater than 140 (top number) or greater than 90 (bottom number)  Stomach pain under your ribs in the middle or right side Headaches or seeing spots Feeling nauseated or throwing up Swelling in face and hands  Zone 3: EMERGENCY  Seek immediate medical care if you have any of the following:  BP reading is greater than160 (top number) or greater than 110 (bottom number) Severe headaches not improving with Tylenol Serious difficulty catching your breath Any worsening symptoms from Zone 2  Preterm Labor and Birth Information  The normal length of a pregnancy is 39-41 weeks. Preterm labor is when labor starts before 37 completed weeks of pregnancy. What are the risk factors for preterm labor? Preterm labor is more likely to occur in women who: Have certain infections during pregnancy such as a bladder infection, sexually transmitted infection, or infection inside the uterus (chorioamnionitis). Have a shorter-than-normal cervix. Have gone into preterm labor before. Have had surgery on their cervix. Are younger than age 17  or older than age 35. Are African American. Are pregnant with twins or multiple babies (multiple gestation). Take street drugs or smoke while pregnant. Do not gain enough weight while pregnant. Became pregnant shortly after having been pregnant. What are the symptoms of preterm labor? Symptoms of preterm labor include: Cramps similar to those that can happen during a menstrual period. The cramps may happen with diarrhea. Pain in the abdomen or lower back. Regular uterine contractions that may feel like tightening of the abdomen. A feeling of increased pressure in the pelvis. Increased watery or bloody mucus discharge from the vagina. Water breaking (ruptured amniotic sac). Why is it important to recognize signs of preterm labor? It is important to recognize signs of preterm labor because babies who are born prematurely may not be fully developed. This can put them at an increased risk for: Long-term (chronic) heart and lung problems. Difficulty immediately after birth with regulating body systems, including blood sugar, body temperature, heart rate, and breathing rate. Bleeding in the brain. Cerebral palsy. Learning difficulties. Death. These risks are highest for babies who are born before 34 weeks   of pregnancy. How is preterm labor treated? Treatment depends on the length of your pregnancy, your condition, and the health of your baby. It may involve: Having a stitch (suture) placed in your cervix to prevent your cervix from opening too early (cerclage). Taking or being given medicines, such as: Hormone medicines. These may be given early in pregnancy to help support the pregnancy. Medicine to stop contractions. Medicines to help mature the baby's lungs. These may be prescribed if the risk of delivery is high. Medicines to prevent your baby from developing cerebral palsy. If the labor happens before 34 weeks of pregnancy, you may need to stay in the hospital. What should I do if I  think I am in preterm labor? If you think that you are going into preterm labor, call your health care provider right away. How can I prevent preterm labor in future pregnancies? To increase your chance of having a full-term pregnancy: Do not use any tobacco products, such as cigarettes, chewing tobacco, and e-cigarettes. If you need help quitting, ask your health care provider. Do not use street drugs or medicines that have not been prescribed to you during your pregnancy. Talk with your health care provider before taking any herbal supplements, even if you have been taking them regularly. Make sure you gain a healthy amount of weight during your pregnancy. Watch for infection. If you think that you might have an infection, get it checked right away. Make sure to tell your health care provider if you have gone into preterm labor before. This information is not intended to replace advice given to you by your health care provider. Make sure you discuss any questions you have with your health care provider. Document Revised: 12/17/2018 Document Reviewed: 01/16/2016 Elsevier Patient Education  2020 Elsevier Inc.   

## 2024-07-13 NOTE — Progress Notes (Signed)
 US : GA = 36+3 weeks Single active female fetus, cephalic, FHR = 163 bpm, anterior pl, gr3, AFI = 12.2 cm, SVP = 4.7 cm, EFW 2824g, 41%, BPP = 8/8, nl ov's, cervix closed

## 2024-07-13 NOTE — Progress Notes (Signed)
 HIGH-RISK PREGNANCY VISIT Patient name: Lauren Guzman MRN [id number]  Date of birth: 1996-07-19 Chief Complaint:   Routine Prenatal Visit and Pregnancy Ultrasound (culture)  History of Present Illness:   Lauren Guzman is a 28 y.o. 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    Today she reports no complaints. Contractions: Irregular.  .  Movement: Present. denies leaking of fluid.      01/26/2024   10:01 AM 11/11/2022    9:18 AM 07/29/2022    2:43 PM  Depression screen PHQ 2/9  Decreased Interest 0 0 0  Down, Depressed, Hopeless 0 0 0  PHQ - 2 Score 0 0 0  Altered sleeping 0 0 0  Tired, decreased energy 0 0 0  Change in appetite 0 0 0  Feeling bad or failure about yourself  0 0 0  Trouble concentrating 0 0 0  Moving slowly or fidgety/restless 0 0 0  Suicidal thoughts 0 0 0  PHQ-9 Score 0 0 0        01/26/2024   10:01 AM 11/11/2022    9:18 AM 07/29/2022    2:43 PM  GAD 7 : Generalized Anxiety Score  Nervous, Anxious, on Edge 0 0 0  Control/stop worrying 0 0 0  Worry too much - different things 0 0 0  Trouble relaxing 0 0 0  Restless 0 0 0  Easily annoyed or irritable 0 0 1  Afraid - awful might happen 0 0 0  Total GAD 7 Score 0 0 1     Review of Systems:   Pertinent items are noted in HPI Denies abnormal vaginal discharge w/ itching/odor/irritation, headaches, visual changes, shortness of breath, chest pain, abdominal pain, severe nausea/vomiting, or problems with urination or bowel movements unless otherwise stated above. Pertinent History Reviewed:  Reviewed past medical,surgical, social, obstetrical and family history.  Reviewed problem list, medications and allergies. Physical Assessment:   Vitals:   07/13/24 1052  BP: 128/84  Pulse: (!) 111  Weight: 247 lb (112 kg)  Body mass index is 45.18 kg/m.            Physical Examination:   General appearance: alert, well appearing, and in no distress  Mental status: alert, oriented to person, place, and time  Skin: warm & dry   Extremities: Edema: Trace    Cardiovascular: normal heart rate noted  Respiratory: normal respiratory effort, no distress  Abdomen: gravid, soft, non-tender  Pelvic: Cervical exam performed  Dilation: 1 Effacement (%): Thick Station: Ballotable  Fetal Status:     Movement: Present Presentation: Vertex  Fetal Surveillance Testing today: US : GA = 36+3 weeks Single active female fetus, cephalic, FHR = 163 bpm, anterior pl, gr3, AFI = 12.2 cm, SVP = 4.7 cm, EFW 2824g, 41%, BPP = 8/8, nl ov's, cervix closed  Chaperone: Peggy Dones  No results found for this or any previous visit (from the past 24 hours).  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   Labs/procedures today: GBS, GC/CT, SVE,  and U/S  Treatment Plan:  EFW q4w    2x/wk nst or weekly BPP    Deliver @ 39-40.6wks   Reviewed: Preterm labor symptoms and general obstetric precautions including but not limited to vaginal bleeding, contractions, leaking of fluid and fetal movement were reviewed in detail with the patient.  All questions were answered. Does have home bp cuff. Office bp cuff given: not applicable. Check bp weekly, let us  know if consistently >140 and/or >90.  Follow-up: Return for As scheduled.   Future Appointments  Date Time Provider Department Center  07/18/2024  9:50 AM [facility] NURSE [facility]  07/21/2024  8:50 AM [facility] NURSE [facility]  07/21/2024  9:10 AM Kizzie Suzen SAUNDERS, CNM [facility]  07/25/2024 10:30 AM  [facility] NURSE [facility]  07/28/2024 10:30 AM [facility] NURSE [facility]  07/28/2024 10:50 AM Newton Mering, CNM [facility]  08/01/2024  9:30 AM [facility] NURSE [facility]  08/01/2024  9:50 AM Kizzie Suzen SAUNDERS, CNM [facility]  08/08/2024 10:30 AM [facility] NURSE [facility]  08/08/2024 10:50 AM Kizzie Suzen SAUNDERS, CNM [facility]    No orders of the defined types were placed in this encounter.  Suzen SAUNDERS Kizzie CNM, [facility] 07/13/2024 11:10 AM

## 2024-07-14 LAB — CERVICOVAGINAL ANCILLARY ONLY
Chlamydia: NEGATIVE
Comment: NEGATIVE
Comment: NORMAL
Neisseria Gonorrhea: NEGATIVE

## 2024-07-16 LAB — CULTURE, BETA STREP (GROUP B ONLY): Strep Gp B Culture: POSITIVE — AB

## 2024-07-18 ENCOUNTER — Ambulatory Visit (INDEPENDENT_AMBULATORY_CARE_PROVIDER_SITE_OTHER): Admitting: *Deleted

## 2024-07-18 VITALS — BP 130/88 | HR 128

## 2024-07-18 DIAGNOSIS — Z3A37 37 weeks gestation of pregnancy: Secondary | ICD-10-CM | POA: Diagnosis not present

## 2024-07-18 DIAGNOSIS — O99213 Obesity complicating pregnancy, third trimester: Secondary | ICD-10-CM

## 2024-07-18 DIAGNOSIS — Z6841 Body Mass Index (BMI) 40.0 and over, adult: Secondary | ICD-10-CM

## 2024-07-18 DIAGNOSIS — O0990 Supervision of high risk pregnancy, unspecified, unspecified trimester: Secondary | ICD-10-CM

## 2024-07-18 DIAGNOSIS — O285 Abnormal chromosomal and genetic finding on antenatal screening of mother: Secondary | ICD-10-CM

## 2024-07-18 NOTE — Progress Notes (Signed)
   NURSE VISIT- NST  SUBJECTIVE:  Lauren Guzman is a 28 y.o. 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  She reports active fetal movement, contractions: none, vaginal bleeding: none, membranes: intact.   OBJECTIVE:  BP 130/88   Pulse (!) 128   LMP  (LMP Unknown)   Appears well, no apparent distress  No results found for this or any previous visit (from the past 24 hours).  NST: FHR baseline 155 bpm, Variability: moderate, Accelerations:present, Decelerations: 1 variable noted= Cat 1/reactive. Patient left on monitor for additional 30 minutes, with no decels noted Toco: none   ASSESSMENT: G2P1001 at [redacted]w[redacted]d with Morbid obesity (BMI >=40) and atypical findingon sex chromosome.  NST reactive  PLAN: EFM strip reviewed by Dr. Jayne   Recommendations: keep next appointment as scheduled    Rutherford Rover  07/18/2024 11:39 AM

## 2024-07-21 ENCOUNTER — Encounter (HOSPITAL_COMMUNITY): Payer: Self-pay | Admitting: *Deleted

## 2024-07-21 ENCOUNTER — Encounter: Payer: Self-pay | Admitting: Women's Health

## 2024-07-21 ENCOUNTER — Telehealth (HOSPITAL_COMMUNITY): Payer: Self-pay | Admitting: *Deleted

## 2024-07-21 ENCOUNTER — Other Ambulatory Visit

## 2024-07-21 ENCOUNTER — Ambulatory Visit: Admitting: Women's Health

## 2024-07-21 VITALS — BP 122/81 | HR 137 | Wt 241.0 lb

## 2024-07-21 DIAGNOSIS — Z3A37 37 weeks gestation of pregnancy: Secondary | ICD-10-CM | POA: Diagnosis not present

## 2024-07-21 DIAGNOSIS — B3731 Acute candidiasis of vulva and vagina: Secondary | ICD-10-CM | POA: Diagnosis not present

## 2024-07-21 DIAGNOSIS — O0993 Supervision of high risk pregnancy, unspecified, third trimester: Secondary | ICD-10-CM | POA: Diagnosis not present

## 2024-07-21 DIAGNOSIS — O1213 Gestational proteinuria, third trimester: Secondary | ICD-10-CM

## 2024-07-21 DIAGNOSIS — Z1389 Encounter for screening for other disorder: Secondary | ICD-10-CM

## 2024-07-21 DIAGNOSIS — Z331 Pregnant state, incidental: Secondary | ICD-10-CM

## 2024-07-21 DIAGNOSIS — O0990 Supervision of high risk pregnancy, unspecified, unspecified trimester: Secondary | ICD-10-CM

## 2024-07-21 DIAGNOSIS — Z6841 Body Mass Index (BMI) 40.0 and over, adult: Secondary | ICD-10-CM

## 2024-07-21 LAB — POCT URINALYSIS DIPSTICK OB
Glucose, UA: NEGATIVE
Nitrite, UA: NEGATIVE

## 2024-07-21 NOTE — Patient Instructions (Signed)
 Lauren Guzman, thank you for choosing our office today! We appreciate the opportunity to meet your healthcare needs. You may receive a short survey by mail, e-mail, or through [employer]. If you are happy with your care we would appreciate if you could take just a few minutes to complete the survey questions. We read all of your comments and take your feedback very seriously. Thank you again for choosing our office.  Center for [employer] Team at [facility]  [facility] at [facility] ([address] [place], [place] [address]) Entrance C, located off of [address] Free 24/7 valet parking   CLASSES: Go to [email or web address] to register for classes (childbirth, breastfeeding, waterbirth, infant CPR, daddy bootcamp, etc.)  Call the office [phone]) or go to [facility] if: You begin to have strong, frequent contractions Your water breaks.  Sometimes it is a big gush of fluid, sometimes it is just a trickle that keeps getting your panties wet or running down your legs You have vaginal bleeding.  It is normal to have a small amount of spotting if your cervix was checked.  You don't feel your baby moving like normal.  If you don't, get you something to eat and drink and lay down and focus on feeling your baby move.   If your baby is still not moving like normal, you should call the office or go to [facility].  Call the office [phone]) or go to [facility] hospital for these signs of pre-eclampsia: Severe headache that does not go away with Tylenol  Visual changes- seeing spots, double, blurred vision Pain under your right breast or upper abdomen that does not go away with Tums or heartburn medicine Nausea and/or vomiting Severe swelling in your hands, feet, and face   [place] [facility]ians/Family Doctors [facility]): [address] Luba BROCKS, [phone]           [facility]: [address], [phone]                 [facility]): [address], [phone] (call to ask if accepting patients) [facility] Department: [address] [place], [phone]    [facility] Pediatricians/Family Doctors [facility]): [address], [phone] [facility]: [address] [place], [phone] [facility]: [address], [phone]  [facility] Doctors  [facility]): [phone] [facility]: [address] [phone]   [facility] Doctors [facility]: [address] [phone]  [facility] Doctors  [facility]: [phone], [phone]  Home Blood Pressure Monitoring for Patients   Your provider has recommended that you check your blood pressure (BP) at least once a week at home. If you do not have a blood pressure cuff at home, one will be provided for you. Contact your provider if you have not received your monitor within 1 week.   Helpful Tips for Accurate Home Blood Pressure Checks  Don't smoke, exercise, or drink caffeine 30 minutes before checking your BP Use the restroom before checking your BP (a full bladder can raise your pressure) Relax in a comfortable upright chair Feet on the ground Left arm resting comfortably on a flat surface at the level of your heart Legs uncrossed Back supported Sit quietly and don't talk Place the cuff on your bare arm Adjust snuggly, so that only two fingertips  can fit between your skin and the top of the cuff Check 2 readings separated by at least one minute Keep a log of your BP readings For a visual, please reference this diagram: http://ccnc.care/bpdiagram  Provider Name: [facility]     Phone: [phone]  Zone 1: ALL CLEAR  Continue to monitor your symptoms:  BP reading is less than 140 (top number) or less than 90 (bottom number)  No right  upper stomach pain No headaches or seeing spots No feeling nauseated or throwing up No swelling in face and hands  Zone 2: CAUTION Call your doctor's office for any of the following:  BP reading is greater than 140 (top number) or greater than 90 (bottom number)  Stomach pain under your ribs in the middle or right side Headaches or seeing spots Feeling nauseated or throwing up Swelling in face and hands  Zone 3: EMERGENCY  Seek immediate medical care if you have any of the following:  BP reading is greater than160 (top number) or greater than 110 (bottom number) Severe headaches not improving with Tylenol  Serious difficulty catching your breath Any worsening symptoms from Zone 2   Braxton Hicks Contractions Contractions of the uterus can occur throughout pregnancy, but they are not always a sign that you are in labor. You may have practice contractions called Braxton Hicks contractions. These false labor contractions are sometimes confused with true labor. What are Darol Irving contractions? Braxton Hicks contractions are tightening movements that occur in the muscles of the uterus before labor. Unlike true labor contractions, these contractions do not result in opening (dilation) and thinning of the cervix. Toward the end of pregnancy (32-34 weeks), Braxton Hicks contractions can happen more often and may become stronger. These contractions are sometimes difficult to tell apart from true labor because they can be very uncomfortable. You should not feel embarrassed if you go to the hospital with false labor. Sometimes, the only way to tell if you are in true labor is for your health care provider to look for changes in the cervix. The health care provider will do a physical exam and may monitor your contractions. If you are not in true labor, the exam should show that your cervix is not dilating and your water has not broken. If there are no other health problems associated with your  pregnancy, it is completely safe for you to be sent home with false labor. You may continue to have Braxton Hicks contractions until you go into true labor. How to tell the difference between true labor and false labor True labor Contractions last 30-70 seconds. Contractions become very regular. Discomfort is usually felt in the top of the uterus, and it spreads to the lower abdomen and low back. Contractions do not go away with walking. Contractions usually become more intense and increase in frequency. The cervix dilates and gets thinner. False labor Contractions are usually shorter and not as strong as true labor contractions. Contractions are usually irregular. Contractions are often felt in the front of the lower abdomen and in the groin. Contractions may go away when you walk around or change positions while lying down. Contractions get weaker and are shorter-lasting as time goes on. The cervix usually does not dilate or become thin. Follow these instructions at home:  Take over-the-counter and prescription medicines only as told by your health care provider. Keep up with your usual exercises and follow other instructions from your health care provider. Eat and drink lightly if you think  you are going into labor. If Braxton Hicks contractions are making you uncomfortable: Change your position from lying down or resting to walking, or change from walking to resting. Sit and rest in a tub of warm water. Drink enough fluid to keep your urine pale yellow. Dehydration may cause these contractions. Do slow and deep breathing several times an hour. Keep all follow-up prenatal visits as told by your health care provider. This is important. Contact a health care provider if: You have a fever. You have continuous pain in your abdomen. Get help right away if: Your contractions become stronger, more regular, and closer together. You have fluid leaking or gushing from your vagina. You pass  blood-tinged mucus (bloody show). You have bleeding from your vagina. You have low back pain that you never had before. You feel your baby's head pushing down and causing pelvic pressure. Your baby is not moving inside you as much as it used to. Summary Contractions that occur before labor are called Braxton Hicks contractions, false labor, or practice contractions. Braxton Hicks contractions are usually shorter, weaker, farther apart, and less regular than true labor contractions. True labor contractions usually become progressively stronger and regular, and they become more frequent. Manage discomfort from [facility] contractions by changing position, resting in a warm bath, drinking plenty of water, or practicing deep breathing. This information is not intended to replace advice given to you by your health care provider. Make sure you discuss any questions you have with your health care provider. Document Revised: 08/07/2017 Document Reviewed: 01/08/2017 Elsevier Patient Education  2020 [employer].

## 2024-07-21 NOTE — Progress Notes (Addendum)
 HIGH-RISK PREGNANCY VISIT Patient name: Lauren Guzman MRN [id number]  Date of birth: 04/09/1996 Chief Complaint:   Routine Prenatal Visit and Non-stress Test (Cervical check)  History of Present Illness:   Lauren Guzman is a 28 y.o. 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    Today she reports vaginal itching and clumpy d/c (did monistat 7) and occasional contractions. Contractions: Not present.  .  Movement: Present. denies leaking of fluid.      01/26/2024   10:01 AM 11/11/2022    9:18 AM 07/29/2022    2:43 PM  Depression screen PHQ 2/9  Decreased Interest 0 0 0  Down, Depressed, Hopeless 0 0 0  PHQ - 2 Score 0 0 0  Altered sleeping 0 0 0  Tired, decreased energy 0 0 0  Change in appetite 0 0 0  Feeling bad or failure about yourself  0 0 0  Trouble concentrating 0 0 0  Moving slowly or fidgety/restless 0 0 0  Suicidal thoughts 0 0 0  PHQ-9 Score 0  0  0      Data saved with a previous flowsheet row definition        01/26/2024   10:01 AM 11/11/2022    9:18 AM 07/29/2022    2:43 PM  GAD 7 : Generalized Anxiety Score  Nervous, Anxious, on Edge 0 0 0  Control/stop worrying 0 0 0  Worry too much - different things 0 0 0  Trouble relaxing 0 0 0  Restless 0 0 0  Easily annoyed or irritable 0 0 1  Afraid - awful might happen 0 0 0  Total GAD 7 Score 0 0 1     Review of Systems:   Pertinent items are noted in HPI Denies abnormal vaginal discharge w/ itching/odor/irritation, headaches, visual changes, shortness of breath, chest pain, abdominal pain, severe nausea/vomiting, or problems with urination or bowel movements unless otherwise stated above. Pertinent History Reviewed:  Reviewed past medical,surgical, social, obstetrical and family history.  Reviewed problem list, medications and allergies. Physical  Assessment:   Vitals:   07/21/24 0908  BP: 122/81  Pulse: (!) 137  Weight: 241 lb (109.3 kg)  Body mass index is 44.08 kg/m.           Physical Examination:   General appearance: alert, well appearing, and in no distress  Mental status: alert, oriented to person, place, and time  Skin: warm & dry   Extremities: Edema: Trace    Cardiovascular: normal heart rate noted  Respiratory: normal respiratory effort, no distress  Abdomen: gravid, soft, non-tender  Pelvic: Cervical exam performed  Dilation: 3 Effacement (%): 50 Station: Ballotable, thick clumpy yellow d/c on exam fingers c/w yeast  Fetal Status:     Movement: Present Presentation: Vertex  Fetal Surveillance Testing today: NST: FHR baseline 145 bpm, Variability: moderate, Accelerations:present, Decelerations:  Absent= Cat 1/reactive Toco: none    Chaperone: Peggy Dones  Results for orders placed or performed in visit on 07/21/24 (from the past 24 hours)  POC Urinalysis Dipstick OB   Collection Time: 07/21/24 11:37 AM  Result Value Ref Range   Color, UA     Clarity, UA     Glucose, UA Negative Negative   Bilirubin, UA     Ketones, UA 2+    Spec Grav, UA  Blood, UA trace    pH, UA     POC,PROTEIN,UA Moderate (2+) Negative, Trace, Small (1+), Moderate (2+), Large (3+), 4+   Urobilinogen, UA     Nitrite, UA negative    Leukocytes, UA Small (1+) (A) Negative   Appearance     Odor      Assessment & Plan:  High-risk pregnancy: G2P1001 at [redacted]w[redacted]d with an Estimated Date of Delivery: 08/07/24   1) 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   Labs/procedures today: SVE and  NST  Treatment Plan:  EFW q4w   2x/wk nst or weekly BPP   Deliver @ 39-40.6wks   Reviewed: Term labor symptoms and general obstetric precautions including but not limited to vaginal bleeding, contractions, leaking of fluid and fetal movement were reviewed in detail with the patient.  All questions were answered. Does have home bp cuff. Office bp cuff given: not applicable. Check bp twice daily (what she has been doing), let us  know if consistently >140 and/or >90.  Follow-up: Return for As scheduled.   Future Appointments  Date Time Provider Department Center  07/25/2024 10:30 AM [facility] NURSE [facility]  07/28/2024 10:30 AM [facility] NURSE [facility]  07/28/2024 10:50 AM Newton Mering, CNM [facility]  07/31/2024  7:00 AM [facility] SCHED ROOM [facility] None    Orders Placed This Encounter  Procedures   Urine Culture   POC Urinalysis Dipstick OB   Suzen JONELLE Fetters [place], [facility] 07/21/2024 9049  Notified by nurse of urine results. BP great, asymptomatic. Will send urine cx.  Suzen FABIENE Fetters, CNM, [facility] 07/21/2024 12:45 PM

## 2024-07-21 NOTE — Addendum Note (Signed)
 Addended by: SANNA GONG A on: 07/21/2024 11:50 AM   Modules accepted: Orders

## 2024-07-21 NOTE — Telephone Encounter (Signed)
 Preadmission screen

## 2024-07-21 NOTE — Addendum Note (Signed)
 Addended by: KIZZIE IHA R on: 07/21/2024 12:45 PM   Modules accepted: Orders

## 2024-07-25 ENCOUNTER — Ambulatory Visit (INDEPENDENT_AMBULATORY_CARE_PROVIDER_SITE_OTHER): Admitting: *Deleted

## 2024-07-25 VITALS — BP 132/84 | HR 92 | Wt 241.0 lb

## 2024-07-25 DIAGNOSIS — Z3A38 38 weeks gestation of pregnancy: Secondary | ICD-10-CM | POA: Diagnosis not present

## 2024-07-25 DIAGNOSIS — Z6841 Body Mass Index (BMI) 40.0 and over, adult: Secondary | ICD-10-CM

## 2024-07-25 DIAGNOSIS — O0993 Supervision of high risk pregnancy, unspecified, third trimester: Secondary | ICD-10-CM

## 2024-07-25 DIAGNOSIS — O0990 Supervision of high risk pregnancy, unspecified, unspecified trimester: Secondary | ICD-10-CM

## 2024-07-25 NOTE — Progress Notes (Signed)
   NURSE VISIT- NST  SUBJECTIVE:  Lauren Guzman is a 28 y.o. G72P1001 female at [redacted]w[redacted]d, here for a NST for pregnancy complicated by Morbid obesity (BMI >=40).  She reports active fetal movement, contractions: none, vaginal bleeding: none, membranes: intact.   OBJECTIVE:  BP 132/84   Pulse 92   Wt 241 lb (109.3 kg)   LMP  (LMP Unknown)   BMI 44.08 kg/m   Appears well, no apparent distress  No results found for this or any previous visit (from the past 24 hours).  NST: FHR baseline 150 bpm, Variability: moderate, Accelerations:present, Decelerations:  Absent= Cat 1/reactive Toco: none   ASSESSMENT: G2P1001 at [redacted]w[redacted]d with Morbid obesity (BMI >=40) NST reactive  PLAN: EFM strip reviewed by Dr. Jayne   Recommendations: keep next appointment as scheduled    Lauren Guzman  07/25/2024 11:08 AM

## 2024-07-26 ENCOUNTER — Ambulatory Visit: Payer: Self-pay | Admitting: Advanced Practice Midwife

## 2024-07-26 LAB — URINE CULTURE

## 2024-07-28 ENCOUNTER — Other Ambulatory Visit

## 2024-07-28 ENCOUNTER — Encounter: Payer: Self-pay | Admitting: Advanced Practice Midwife

## 2024-07-28 ENCOUNTER — Ambulatory Visit (INDEPENDENT_AMBULATORY_CARE_PROVIDER_SITE_OTHER): Admitting: Advanced Practice Midwife

## 2024-07-28 VITALS — BP 125/83 | HR 90 | Wt 243.0 lb

## 2024-07-28 DIAGNOSIS — Z3A38 38 weeks gestation of pregnancy: Secondary | ICD-10-CM | POA: Diagnosis not present

## 2024-07-28 DIAGNOSIS — Z6841 Body Mass Index (BMI) 40.0 and over, adult: Secondary | ICD-10-CM

## 2024-07-28 DIAGNOSIS — O99213 Obesity complicating pregnancy, third trimester: Secondary | ICD-10-CM

## 2024-07-28 DIAGNOSIS — E669 Obesity, unspecified: Secondary | ICD-10-CM

## 2024-07-28 DIAGNOSIS — O0990 Supervision of high risk pregnancy, unspecified, unspecified trimester: Secondary | ICD-10-CM

## 2024-07-28 MED ORDER — FLUCONAZOLE 150 MG PO TABS: ORAL_TABLET | ORAL | 2 refills | Status: DC

## 2024-07-28 NOTE — Progress Notes (Signed)
 HIGH-RISK PREGNANCY VISIT Patient name: Lauren Guzman MRN [id number]  Date of birth: 06-09-1996 Chief Complaint:   Routine Prenatal Visit and Non-stress Test (Cervical check)  History of Present Illness:   Lauren Guzman is a 28 y.o. G42P1001 female at [redacted]w[redacted]d with an Estimated Date of Delivery: 08/07/24 being seen today for ongoing management of a high-risk pregnancy complicated by morbid obesity BMI >=40.    Today she reports still has yeast inf sx (clumpy DC, no itch)  took a weeks worth of vagianl monistat .. Contractions: Irregular.  .  Movement: Present. denies leaking of fluid.      01/26/2024   10:01 AM 11/11/2022    9:18 AM 07/29/2022    2:43 PM  Depression screen PHQ 2/9  Decreased Interest 0 0 0  Down, Depressed, Hopeless 0 0 0  PHQ - 2 Score 0 0 0  Altered sleeping 0 0 0  Tired, decreased energy 0 0 0  Change in appetite 0 0 0  Feeling bad or failure about yourself  0 0 0  Trouble concentrating 0 0 0  Moving slowly or fidgety/restless 0 0 0  Suicidal thoughts 0 0 0  PHQ-9 Score 0  0  0      Data saved with a previous flowsheet row definition        01/26/2024   10:01 AM 11/11/2022    9:18 AM 07/29/2022    2:43 PM  GAD 7 : Generalized Anxiety Score  Nervous, Anxious, on Edge 0 0 0  Control/stop worrying 0 0 0  Worry too much - different things 0 0 0  Trouble relaxing 0 0 0  Restless 0 0 0  Easily annoyed or irritable 0 0 1  Afraid - awful might happen 0 0 0  Total GAD 7 Score 0 0 1     Review of Systems:   Pertinent items are noted in HPI Denies abnormal vaginal discharge w/ itching/odor/irritation, headaches, visual changes, shortness of breath, chest pain, abdominal pain, severe nausea/vomiting, or problems with urination or bowel movements unless otherwise stated above. Pertinent History Reviewed:  Reviewed past medical,surgical, social, obstetrical and family history.  Reviewed problem list, medications and allergies. Physical Assessment:   Vitals:    07/28/24 1052  BP: 125/83  Pulse: 90  Weight: 243 lb (110.2 kg)  Body mass index is 44.45 kg/m.           Physical Examination:   General appearance: alert, well appearing, and in no distress  Mental status: alert, oriented to person, place, and time  Skin: warm & dry   Extremities: Edema: None    Cardiovascular: normal heart rate noted  Respiratory: normal respiratory effort, no distress  Abdomen: gravid, soft, non-tender  Pelvic: Cervical exam performed  Dilation: 3 Effacement (%): 50 Station: -3  SSE:  large amount of clumpy DC c/w yeast  Chaperone: Peggy Dones    Fetal Status:     Movement: Present Presentation: Vertex  Fetal Surveillance Testing today: NST: FHR baseline 145 bpm, Variability: moderate, Accelerations:present, Decelerations:  Absent= Cat 1/Reactive      No results found for this or any previous visit (from the past 24 hours).  Assessment & Plan:  High-risk pregnancy: G2P1001 at [redacted]w[redacted]d with an Estimated Date of Delivery: 08/07/24   1. Supervision of high risk pregnancy, antepartum (Primary)   2. [redacted] weeks gestation of pregnancy   3. BMI 40.0-44.9, adult (HCC) IOL set for 11/23    Meds:  Meds ordered  this encounter  Medications   fluconazole  (DIFLUCAN ) 150 MG tablet    Sig: 1 po stat; repeat in 3 days    Dispense:  2 tablet    Refill:  2    Supervising Provider:   JAYNE MINDER H [[id number]]    Orders: No orders of the defined types were placed in this encounter.    Labs/procedures today: NST   Reviewed: Term labor symptoms and general obstetric precautions including but not limited to vaginal bleeding, contractions, leaking of fluid and fetal movement were reviewed in detail with the patient.  All questions were answered. Does have home bp cuff. Office bp cuff given: not applicable. Check bp daily, let us  know if consistently >140 and/or >90.  Follow-up: No follow-ups on file.   Future Appointments  Date Time Provider Department Center   07/31/2024  7:00 AM [facility] SCHED ROOM [facility] None    No orders of the defined types were placed in this encounter.  Cathlean Ely , DNP, CNM [facility] 07/28/2024 12:50 PM

## 2024-07-31 ENCOUNTER — Inpatient Hospital Stay (HOSPITAL_COMMUNITY)
Admission: RE | Admit: 2024-07-31 | Discharge: 2024-08-01 | DRG: 806 | Disposition: A | Discharge status: Home / Self Care | Attending: Obstetrics & Gynecology | Admitting: Obstetrics & Gynecology

## 2024-07-31 ENCOUNTER — Other Ambulatory Visit: Payer: Self-pay

## 2024-07-31 ENCOUNTER — Encounter (HOSPITAL_COMMUNITY): Payer: Self-pay | Admitting: Obstetrics & Gynecology

## 2024-07-31 ENCOUNTER — Inpatient Hospital Stay (HOSPITAL_COMMUNITY): Admitting: Anesthesiology

## 2024-07-31 ENCOUNTER — Inpatient Hospital Stay (HOSPITAL_COMMUNITY)

## 2024-07-31 DIAGNOSIS — O99344 Other mental disorders complicating childbirth: Secondary | ICD-10-CM | POA: Diagnosis not present

## 2024-07-31 DIAGNOSIS — O99820 Streptococcus B carrier state complicating pregnancy: Secondary | ICD-10-CM | POA: Diagnosis not present

## 2024-07-31 DIAGNOSIS — Z3A39 39 weeks gestation of pregnancy: Secondary | ICD-10-CM

## 2024-07-31 DIAGNOSIS — Z6841 Body Mass Index (BMI) 40.0 and over, adult: Secondary | ICD-10-CM

## 2024-07-31 DIAGNOSIS — O99214 Obesity complicating childbirth: Secondary | ICD-10-CM | POA: Diagnosis not present

## 2024-07-31 LAB — CBC
HCT: 33.5 % — ABNORMAL LOW (ref 36.0–46.0)
Hemoglobin: 10.9 g/dL — ABNORMAL LOW (ref 12.0–15.0)
MCH: 26.6 pg (ref 26.0–34.0)
MCHC: 32.5 g/dL (ref 30.0–36.0)
MCV: 81.7 fL (ref 80.0–100.0)
Platelets: 175 K/uL (ref 150–400)
RBC: 4.1 MIL/uL (ref 3.87–5.11)
RDW: 16.6 % — ABNORMAL HIGH (ref 11.5–15.5)
WBC: 8.4 K/uL (ref 4.0–10.5)
nRBC: 0 % (ref 0.0–0.2)

## 2024-07-31 LAB — TYPE AND SCREEN
ABO/RH(D): A POS
Antibody Screen: NEGATIVE

## 2024-07-31 LAB — SYPHILIS: RPR W/REFLEX TO RPR TITER AND TREPONEMAL ANTIBODIES, TRADITIONAL SCREENING AND DIAGNOSIS ALGORITHM: RPR Ser Ql: NONREACTIVE

## 2024-07-31 MED ORDER — SIMETHICONE 80 MG PO CHEW: 80.0000 mg | CHEWABLE_TABLET | ORAL | Status: DC | PRN

## 2024-07-31 MED ORDER — LACTATED RINGERS IV SOLN: 500.0000 mL | INTRAVENOUS | Status: DC | PRN

## 2024-07-31 MED ORDER — PENICILLIN G POT IN DEXTROSE 60000 UNIT/ML IV SOLN
3.0000 10*6.[IU] | INTRAVENOUS | Status: DC
  Administered 2024-07-31: 3 10*6.[IU] via INTRAVENOUS
  Filled 2024-07-31: qty 50

## 2024-07-31 MED ORDER — OXYTOCIN BOLUS FROM INFUSION
333.0000 mL | Freq: Once | INTRAVENOUS | Status: AC
  Administered 2024-07-31: 333 mL via INTRAVENOUS

## 2024-07-31 MED ORDER — SOD CITRATE-CITRIC ACID 500-334 MG/5ML PO SOLN: 30.0000 mL | ORAL | Status: DC | PRN

## 2024-07-31 MED ORDER — WITCH HAZEL-GLYCERIN EX PADS: 1.0000 | MEDICATED_PAD | CUTANEOUS | Status: DC | PRN

## 2024-07-31 MED ORDER — EPHEDRINE 5 MG/ML INJ: 10.0000 mg | INTRAVENOUS | Status: DC | PRN

## 2024-07-31 MED ORDER — DIPHENHYDRAMINE HCL 25 MG PO CAPS: 25.0000 mg | ORAL_CAPSULE | Freq: Four times a day (QID) | ORAL | Status: DC | PRN

## 2024-07-31 MED ORDER — DIBUCAINE (PERIANAL) 1 % EX OINT: 1.0000 | TOPICAL_OINTMENT | CUTANEOUS | Status: DC | PRN

## 2024-07-31 MED ORDER — PRENATAL MULTIVITAMIN CH
1.0000 | ORAL_TABLET | Freq: Every day | ORAL | Status: DC
  Administered 2024-08-01: 1 via ORAL
  Filled 2024-07-31: qty 1

## 2024-07-31 MED ORDER — LACTATED RINGERS IV SOLN: INTRAVENOUS | Status: DC

## 2024-07-31 MED ORDER — FENTANYL-BUPIVACAINE-NACL 0.5-0.125-0.9 MG/250ML-% EP SOLN
12.0000 mL/h | EPIDURAL | Status: DC | PRN
  Administered 2024-07-31: 12 mL/h via EPIDURAL
  Filled 2024-07-31: qty 250

## 2024-07-31 MED ORDER — LIDOCAINE HCL (PF) 1 % IJ SOLN
INTRAMUSCULAR | Status: DC | PRN
  Administered 2024-07-31 (×2): 4 mL via EPIDURAL

## 2024-07-31 MED ORDER — DIPHENHYDRAMINE HCL 50 MG/ML IJ SOLN: 12.5000 mg | INTRAMUSCULAR | Status: DC | PRN

## 2024-07-31 MED ORDER — ACETAMINOPHEN 325 MG PO TABS: 650.0000 mg | ORAL_TABLET | ORAL | Status: DC | PRN

## 2024-07-31 MED ORDER — PHENYLEPHRINE 80 MCG/ML (10ML) SYRINGE FOR IV PUSH (FOR BLOOD PRESSURE SUPPORT): 80.0000 ug | PREFILLED_SYRINGE | INTRAVENOUS | Status: DC | PRN

## 2024-07-31 MED ORDER — ONDANSETRON HCL 4 MG/2ML IJ SOLN: 4.0000 mg | INTRAMUSCULAR | Status: DC | PRN

## 2024-07-31 MED ORDER — BENZOCAINE-MENTHOL 20-0.5 % EX AERO: 1.0000 | INHALATION_SPRAY | CUTANEOUS | Status: DC | PRN

## 2024-07-31 MED ORDER — FENTANYL CITRATE (PF) 100 MCG/2ML IJ SOLN: 100.0000 ug | INTRAMUSCULAR | Status: DC | PRN

## 2024-07-31 MED ORDER — LACTATED RINGERS IV SOLN
500.0000 mL | Freq: Once | INTRAVENOUS | Status: AC
  Administered 2024-07-31: 500 mL via INTRAVENOUS

## 2024-07-31 MED ORDER — OXYCODONE-ACETAMINOPHEN 5-325 MG PO TABS: 2.0000 | ORAL_TABLET | ORAL | Status: DC | PRN

## 2024-07-31 MED ORDER — SODIUM CHLORIDE 0.9 % IV SOLN
5.0000 10*6.[IU] | Freq: Once | INTRAVENOUS | Status: AC
  Administered 2024-07-31: 5 10*6.[IU] via INTRAVENOUS
  Filled 2024-07-31: qty 5

## 2024-07-31 MED ORDER — HYDROXYZINE HCL 50 MG PO TABS: 50.0000 mg | ORAL_TABLET | Freq: Four times a day (QID) | ORAL | Status: DC | PRN

## 2024-07-31 MED ORDER — IBUPROFEN 600 MG PO TABS
600.0000 mg | ORAL_TABLET | Freq: Four times a day (QID) | ORAL | Status: DC
  Administered 2024-07-31 – 2024-08-01 (×4): 600 mg via ORAL
  Filled 2024-07-31 (×4): qty 1

## 2024-07-31 MED ORDER — FLEET ENEMA RE ENEM: 1.0000 | ENEMA | RECTAL | Status: DC | PRN

## 2024-07-31 MED ORDER — ZOLPIDEM TARTRATE 5 MG PO TABS: 5.0000 mg | ORAL_TABLET | Freq: Every evening | ORAL | Status: DC | PRN

## 2024-07-31 MED ORDER — LIDOCAINE HCL (PF) 1 % IJ SOLN: 30.0000 mL | INTRAMUSCULAR | Status: DC | PRN

## 2024-07-31 MED ORDER — ONDANSETRON HCL 4 MG PO TABS: 4.0000 mg | ORAL_TABLET | ORAL | Status: DC | PRN

## 2024-07-31 MED ORDER — ONDANSETRON HCL 4 MG/2ML IJ SOLN: 4.0000 mg | Freq: Four times a day (QID) | INTRAMUSCULAR | Status: DC | PRN

## 2024-07-31 MED ORDER — OXYTOCIN-SODIUM CHLORIDE 30-0.9 UT/500ML-% IV SOLN
1.0000 m[IU]/min | INTRAVENOUS | Status: DC
  Administered 2024-07-31: 2 m[IU]/min via INTRAVENOUS
  Filled 2024-07-31: qty 500

## 2024-07-31 MED ORDER — OXYCODONE-ACETAMINOPHEN 5-325 MG PO TABS: 1.0000 | ORAL_TABLET | ORAL | Status: DC | PRN

## 2024-07-31 MED ORDER — COCONUT OIL OIL: 1.0000 | TOPICAL_OIL | Status: DC | PRN

## 2024-07-31 MED ORDER — SENNOSIDES-DOCUSATE SODIUM 8.6-50 MG PO TABS
2.0000 | ORAL_TABLET | Freq: Every day | ORAL | Status: DC
  Administered 2024-08-01: 2 via ORAL
  Filled 2024-07-31: qty 2

## 2024-07-31 MED ORDER — OXYTOCIN-SODIUM CHLORIDE 30-0.9 UT/500ML-% IV SOLN: 2.5000 [IU]/h | INTRAVENOUS | Status: DC

## 2024-07-31 MED ORDER — TERBUTALINE SULFATE 1 MG/ML IJ SOLN: 0.2500 mg | Freq: Once | INTRAMUSCULAR | Status: DC | PRN

## 2024-07-31 NOTE — H&P (Addendum)
 OBSTETRIC ADMISSION HISTORY AND PHYSICAL  Lauren Guzman is a 28 y.o. female G2P1001 with IUP at [redacted]w[redacted]d by 7w US  presenting for IOL for BMI>40.  Lauren Guzman She reports +FMs, No LOF, no VB, no blurry vision, headaches or peripheral edema, and RUQ pain.  She plans on breast feeding. She request POP for birth control. She received her prenatal care at [facility]   Dating: By bayard US  --->  Estimated Date of Delivery: 08/07/24  Sono:    @[redacted]w[redacted]d , CWD, normal anatomy, cephalic presentation, 2824g, 36% EFW   Prenatal History/Complications: BMI>40  Past Medical History: Past Medical History:  Diagnosis Date   Anxiety    Headache    migraines come and go (due to concussion)   History of concussion 03/2017   Pregnancy induced hypertension    Vaginal Pap smear, abnormal     Past Surgical History: Past Surgical History:  Procedure Laterality Date   ARTHROSCOPIC REPAIR ACL      Obstetrical History: OB History     Gravida  2   Para  1   Term  1   Preterm      AB      Living  1      SAB      IAB      Ectopic      Multiple  0   Live Births  1           Social History Social History   Socioeconomic History   Marital status: Single    Spouse name: Not on file   Number of children: 0   Years of education: college student   Highest education level: Not on file  Occupational History   Occupation: Archivist  Tobacco Use   Smoking status: Never   Smokeless tobacco: Never  Vaping Use   Vaping status: Never Used  Substance and Sexual Activity   Alcohol use: No   Drug use: No   Sexual activity: Yes    Birth control/protection: None  Other Topics Concern   Not on file  Social History Narrative   Lives at home with her parents.   Occasional caffeine use.   Right-handed.   Social Drivers of Corporate Investment Banker Strain: Low Risk  (01/26/2024)   Overall Financial Resource Strain (CARDIA)    Difficulty of Paying Living Expenses: Not hard at all  Food  Insecurity: No Food Insecurity (07/31/2024)   Hunger Vital Sign    Worried About Running Out of Food in the Last Year: Never true    Ran Out of Food in the Last Year: Never true  Transportation Needs: No Transportation Needs (07/31/2024)   PRAPARE - Administrator, Civil Service (Medical): No    Lack of Transportation (Non-Medical): No  Physical Activity: Insufficiently Active (01/26/2024)   Exercise Vital Sign    Days of Exercise per Week: 1 day    Minutes of Exercise per Session: 10 min  Stress: No Stress Concern Present (01/26/2024)   [employer] of Occupational Health - Occupational Stress Questionnaire    Feeling of Stress : Not at all  Social Connections: Moderately Isolated (01/26/2024)   Social Connection and Isolation Panel    Frequency of Communication with Friends and Family: More than three times a week    Frequency of Social Gatherings with Friends and Family: More than three times a week    Attends Religious Services: More than 4 times per year    Active Member of  Clubs or Organizations: No    Attends Engineer, Structural: Never    Marital Status: Never married    Family History: Family History  Problem Relation Age of Onset   Hypertension Mother    Diabetes Father    Hypertension Father    Deep vein thrombosis Maternal Grandmother    Diabetes Maternal Grandfather    Lung cancer Maternal Grandfather    Prostate cancer Maternal Grandfather    Colon cancer Maternal Grandfather    Cancer - Lung Maternal Grandfather    Diabetes Paternal Grandmother    Diabetes Paternal Grandfather     Allergies: Allergies  Allergen Reactions   Kiwi Extract     Medications Prior to Admission  Medication Sig Dispense Refill Last Dose/Taking   aspirin  EC 81 MG tablet Take 2 tablets (162 mg total) by mouth daily. Swallow whole. 180 tablet 2    fluconazole  (DIFLUCAN ) 150 MG tablet 1 po stat; repeat in 3 days 2 tablet 2    metroNIDAZOLE  (FLAGYL ) 500 MG  tablet Take 1 tablet (500 mg total) by mouth 2 (two) times daily. (Patient not taking: Reported on 07/28/2024) 14 tablet 0    miconazole  (MONISTAT  7) 2 % vaginal cream Place 1 Applicatorful vaginally at bedtime. X 7 nights, no sex while using (Patient not taking: Reported on 07/28/2024) 45 g 0    Prenatal Vit-Fe Fumarate-FA (PRENATAL VITAMIN PO) Take by mouth.        Review of Systems   All systems reviewed and negative except as stated in HPI  Blood pressure 134/75, pulse 97, temperature 98.9 F (37.2 C), temperature source Oral, resp. rate 18, height 5' 2 (1.575 m), weight 110.6 kg, currently breastfeeding. General appearance: alert, cooperative, and appears stated age Lungs: clear to auscultation bilaterally Heart: regular rate and rhythm Abdomen: soft, non-tender; bowel sounds normal Extremities: Homans sign is negative, no sign of DVT Presentation: cephalic  Fetal monitoring Baseline: 160,  bpm Uterine activityFrequency: Every 3-5 minutes  Dilation: 4 Effacement (%): 70 Station: -2 Exam by:: Rosina Pereyra RN   Prenatal labs: ABO, Rh: --/--/A POS (11/23 0825) Antibody: NEG (11/23 0825) Rubella: 4.31 (05/20 1113) RPR: Non Reactive (09/10 0904)  HBsAg: Negative (05/20 1113)  HIV: Non Reactive (09/10 0904)  GBS: Positive/-- (11/05 1543)    Lab Results  Component Value Date   GBS Positive (A) 07/13/2024   GTT 87/132/107 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) Anatomy US  nl  Immunization History  Administered Date(s) Administered   DTaP 07/07/1996, 09/19/1996, 12/01/1996, 03/28/1998, 09/29/2000   HIB (PRP-OMP) 07/07/1996, 09/19/1996, 12/01/1996, 03/28/1998   HPV Quadrivalent 04/02/2006, 06/22/2006, 04/22/2007, 06/23/2011   Hepatitis A, Ped/Adol-2 Dose 11/23/2013   Hepatitis B 07/07/1996, 12/03/1996, 05/10/2006   IPV 07/07/1996, 09/19/1996, 03/28/1998, 09/29/2000   Influenza Whole 07/03/2006, 09/29/2011   MMR 05/09/1997,  09/29/2000   Meningococcal Conjugate 04/22/2007, 11/23/2013   Moderna Sars-Covid-2 Vaccination 11/17/2019, 12/20/2019   Tdap 04/22/2007, 11/11/2022, 06/01/2024   Varicella 04/15/1999, 04/22/2007    Prenatal Transfer Tool  Maternal Diabetes: No Genetic Screening: Abnormal:  Results: Other: sex chromosome abnormality Maternal Ultrasounds/Referrals: Normal Fetal Ultrasounds or other Referrals:  Referred to Materal Fetal Medicine  Maternal Substance Abuse:  No Significant Maternal Medications:  None Significant Maternal Lab Results: Group B Strep positive - penicillin  ordered Number of Prenatal Visits:greater than 3 verified prenatal visits Maternal Vaccinations:TDap Other Comments:  None   Results for orders placed or performed during the hospital encounter of 07/31/24 (from the past  24 hours)  CBC   Collection Time: 07/31/24  8:25 AM  Result Value Ref Range   WBC 8.4 4.0 - 10.5 K/uL   RBC 4.10 3.87 - 5.11 MIL/uL   Hemoglobin 10.9 (L) 12.0 - 15.0 g/dL   HCT 66.4 (L) 63.9 - 53.9 %   MCV 81.7 80.0 - 100.0 fL   MCH 26.6 26.0 - 34.0 pg   MCHC 32.5 30.0 - 36.0 g/dL   RDW 83.3 (H) 88.4 - 84.4 %   Platelets 175 150 - 400 K/uL   nRBC 0.0 0.0 - 0.2 %  Type and screen   Collection Time: 07/31/24  8:25 AM  Result Value Ref Range   ABO/RH(D) A POS    Antibody Screen NEG    Sample Expiration      08/03/2024,2359 Performed at [facility], [address], [place], [place] [address]     Patient Active Problem List   Diagnosis Date Noted   BMI 45.0-49.9, adult (HCC) 07/31/2024   Trichomonas infection 06/02/2024   Obesity affecting pregnancy, antepartum 03/10/2024   Abnormal Pap smear of cervix 02/04/2024   Chlamydia 02/03/2024   History of gestational hypertension 01/26/2024   Supervision of high risk pregnancy, antepartum 01/26/2024   Anxiety 01/26/2024   BMI 40.0-44.9, adult (HCC) 01/26/2024   Breast mass 12/23/2022   Abnormal chromosomal and genetic finding on antenatal  screening mother 08/12/2022    Assessment/Plan:  Lauren Guzman is a 28 y.o. G2P1001 at [redacted]w[redacted]d here for IOL for BMI>40.  #Labor: cervix is favorable. Start pitocin  #Pain: Per pt preference.  Epidural per request #FWB: Cat 1 tracing #GBS status:  Positive - penicillin  ordered #Feeding: Breastmilk  #Reproductive Life planning: Progesterone only pills  #Circ:  yes   Lauren JONELLE Carpen, MD  07/31/2024, 9:34 AM

## 2024-07-31 NOTE — Progress Notes (Signed)
 Labor Progress Note Lauren Guzman is a 28 y.o. G2P1001 at [redacted]w[redacted]d presented for IOL for BMI >40 with favorable cervix S: Comfortable, minimal contractions  O:  BP (!) 148/97   Pulse (!) 120   Temp 98.2 F (36.8 C) (Oral)   Resp 18   Ht 5' 2 (1.575 m)   Wt 110.6 kg   LMP  (LMP Unknown)   BMI 44.59 kg/m  EFM: 150/moderate/+accels, no decels  CVE: Dilation: 4.5 Effacement (%): 70 Station: -2 Presentation: Vertex Exam by:: Dr. Eldonna  AROM performed with copious clear fluid  A&P: 28 y.o. G2P1001 [redacted]w[redacted]d IOL BMI > 40 #Labor: Progressing well. AROM successful. If ctx are not q3-4 within 4 hours will consider augmentation #Pain: epidural PRN #FWB: Cat 1 #GBS positive- PCN  Suzen Maryan Eldonna, MD 10:32 AM

## 2024-07-31 NOTE — Anesthesia Preprocedure Evaluation (Signed)
 Anesthesia Evaluation  Patient identified by MRN, date of birth, ID band Patient awake    Reviewed: Allergy & Precautions, NPO status , Patient's Chart, lab work & pertinent test results  History of Anesthesia Complications Negative for: history of anesthetic complications  Airway Mallampati: III  TM Distance: >3 FB Neck ROM: Full    Dental   Pulmonary neg pulmonary ROS   Pulmonary exam normal breath sounds clear to auscultation       Cardiovascular hypertension (gestational),  Rhythm:Regular Rate:Normal     Neuro/Psych  Headaches, neg Seizures  Anxiety     H/o concussion 03/2017    GI/Hepatic negative GI ROS, Neg liver ROS,,,  Endo/Other  neg diabetes  Class 3 obesity  Renal/GU negative Renal ROS     Musculoskeletal   Abdominal  (+) + obese  Peds  Hematology  (+) Blood dyscrasia, anemia Lab Results      Component                Value               Date                      WBC                      8.4                 07/31/2024                HGB                      10.9 (L)            07/31/2024                HCT                      33.5 (L)            07/31/2024                MCV                      81.7                07/31/2024                PLT                      175                 07/31/2024              Anesthesia Other Findings Takes baby aspirin   Reproductive/Obstetrics (+) Pregnancy                              Anesthesia Physical Anesthesia Plan  ASA: 3  Anesthesia Plan: Epidural   Post-op Pain Management:    Induction:   PONV Risk Score and Plan:   Airway Management Planned: Natural Airway  Additional Equipment:   Intra-op Plan:   Post-operative Plan:   Informed Consent: I have reviewed the patients History and Physical, chart, labs and discussed the procedure including the risks, benefits and alternatives for the proposed anesthesia with the patient  or authorized representative who has indicated his/her understanding and acceptance.  Plan Discussed with: Anesthesiologist  Anesthesia Plan Comments: (I have discussed risks of neuraxial anesthesia including but not limited to infection, bleeding, nerve injury, back pain, headache, seizures, and failure of block. Patient denies bleeding disorders and is not currently anticoagulated. Labs have been reviewed. Risks and benefits discussed. All patient's questions answered.  )        Anesthesia Quick Evaluation

## 2024-07-31 NOTE — Discharge Summary (Signed)
 Postpartum Discharge Summary  Date of Service updated***     Patient Name: Lauren Guzman DOB: [birth year] MRN: [id number]  Date of admission: 07/31/2024 Delivery date:07/31/2024 Delivering provider: MAGALI BARKLEY LITTIE Date of discharge: 07/31/2024  Admitting diagnosis: BMI 45.0-49.9, adult (HCC) [Z68.42] Intrauterine pregnancy: [redacted]w[redacted]d     Secondary diagnosis:  Principal Problem:   SVD (spontaneous vaginal delivery) Active Problems:   BMI 45.0-49.9, adult [facility])  Additional problems: ***    Discharge diagnosis: Term Pregnancy Delivered                                              Post partum procedures:{Postpartum procedures:23558} Augmentation: AROM and Pitocin  Complications: None  Hospital course: Induction of Labor With Vaginal Delivery   28 y.o. yo G2P1001 at [redacted]w[redacted]d was admitted to the hospital 07/31/2024 for induction of labor.  Indication for induction: BMI.  Patient had an labor course complicated by NA Membrane Rupture Time/Date: 10:02 AM,07/31/2024  Delivery Method:Vaginal, Spontaneous Operative Delivery:N/A Episiotomy: None Lacerations:  1st degree;Periurethral;Perineal Details of delivery can be found in separate delivery note.  Patient had a postpartum course complicated by***. Patient is discharged home 07/31/24.  Newborn Data: Birth date:07/31/2024 Birth time:2:44 PM Gender:Female Living status:Living Apgars:8 ,9  Weight:   Magnesium Sulfate received: {Mag received:30440022} BMZ received: {BMZ received:30440023} Rhophylac:N/A MMR:N/A T-DaP:Given prenatally Flu: {Qol:76036} RSV Vaccine received: No Transfusion:{Transfusion received:30440034}  Immunizations received: Immunization History  Administered Date(s) Administered   DTaP 07/07/1996, 09/19/1996, 12/01/1996, 03/28/1998, 09/29/2000   HIB (PRP-OMP) 07/07/1996, 09/19/1996, 12/01/1996, 03/28/1998   HPV Quadrivalent 04/02/2006, 06/22/2006, 04/22/2007, 06/23/2011   Hepatitis A, Ped/Adol-2 Dose  11/23/2013   Hepatitis B 07/07/1996, 12/03/1996, 05/10/2006   IPV 07/07/1996, 09/19/1996, 03/28/1998, 09/29/2000   Influenza Whole 07/03/2006, 09/29/2011   MMR 05/09/1997, 09/29/2000   Meningococcal Conjugate 04/22/2007, 11/23/2013   Moderna Sars-Covid-2 Vaccination 11/17/2019, 12/20/2019   Tdap 04/22/2007, 11/11/2022, 06/01/2024   Varicella 04/15/1999, 04/22/2007    Physical exam  Vitals:   07/31/24 1449 07/31/24 1500 07/31/24 1515 07/31/24 1530  BP: (!) 137/55 134/72 135/73 (!) 140/85  Pulse: (!) 123 (!) 112 (!) 111 (!) 113  Resp:      Temp:      TempSrc:      SpO2:      Weight:      Height:       General: {Exam; general:21111117} Lochia: {Desc; appropriate/inappropriate:30686::appropriate} Uterine Fundus: {Desc; firm/soft:30687} Incision: {Exam; incision:21111123} DVT Evaluation: {Exam; dvt:2111122} Labs: Lab Results  Component Value Date   WBC 8.4 07/31/2024   HGB 10.9 (L) 07/31/2024   HCT 33.5 (L) 07/31/2024   MCV 81.7 07/31/2024   PLT 175 07/31/2024      Latest Ref Rng & Units 01/26/2024   11:13 AM  CMP  Glucose 70 - 99 mg/dL 88   BUN 6 - 20 mg/dL 7   Creatinine 9.42 - 8.99 mg/dL 9.47   Sodium 865 - 855 mmol/L 136   Potassium 3.5 - 5.2 mmol/L 4.5   Chloride 96 - 106 mmol/L 101   CO2 20 - 29 mmol/L 22   Calcium 8.7 - 10.2 mg/dL 9.5   Total Protein 6.0 - 8.5 g/dL 7.2   Total Bilirubin 0.0 - 1.2 mg/dL <9.7   Alkaline Phos 44 - 121 IU/L 61   AST 0 - 40 IU/L 10   ALT 0 - 32 IU/L 10  Edinburgh Score:    03/19/2023    1:33 PM  Edinburgh Postnatal Depression Scale Screening Tool  I have been able to laugh and see the funny side of things. 0   I have looked forward with enjoyment to things. 0   I have blamed myself unnecessarily when things went wrong. 0   I have been anxious or worried for no good reason. 0   I have felt scared or panicky for no good reason. 0   Things have been getting on top of me. 0   I have been so unhappy that I have had  difficulty sleeping. 0   I have felt sad or miserable. 0   I have been so unhappy that I have been crying. 0   The thought of harming myself has occurred to me. 0   Edinburgh Postnatal Depression Scale Total 0      Data saved with a previous flowsheet row definition   No data recorded  After visit meds:  Allergies as of 07/31/2024       Reactions   Kiwi Extract      Med Rec must be completed prior to using this [facility]***        Discharge home in stable condition Infant Feeding: Breast Infant Disposition:home with mother Discharge instruction: per After Visit Summary and Postpartum booklet. Activity: Advance as tolerated. Pelvic rest for 6 weeks.  Diet: routine diet Future Appointments:No future appointments. Follow up Visit:  Appt scheduling msg sent 11/23  Please schedule this patient for a In person postpartum visit in 6 weeks with the following provider: Any provider. Additional Postpartum F/U:NA  High risk pregnancy complicated by: BMI Delivery mode:  Vaginal, Spontaneous Anticipated Birth Control:  POPs   07/31/2024 Paralee JONELLE Carpen, MD

## 2024-07-31 NOTE — Anesthesia Procedure Notes (Signed)
 Epidural Patient location during procedure: OB Start time: 07/31/2024 10:50 AM End time: 07/31/2024 10:56 AM  Staffing Anesthesiologist: Peggye Delon Brunswick, MD Performed: anesthesiologist   Preanesthetic Checklist Completed: patient identified, IV checked, risks and benefits discussed, monitors and equipment checked, pre-op evaluation and timeout performed  Epidural Patient position: sitting Prep: DuraPrep and site prepped and draped Patient monitoring: continuous pulse ox and blood pressure Approach: midline Location: L3-L4 Injection technique: LOR saline  Needle:  Needle type: Tuohy  Needle gauge: 17 G Needle length: 9 cm and 9 Needle insertion depth: 8 cm Catheter type: closed end flexible Catheter size: 19 Gauge Catheter at skin depth: 13 cm Test dose: negative  Assessment Events: blood not aspirated, no cerebrospinal fluid, injection not painful, no injection resistance, no paresthesia and negative IV test  Additional Notes The patient has requested an epidural for labor pain management. Risks and benefits including, but not limited to, infection, bleeding, local anesthetic toxicity, headache, hypotension, back pain, block failure, etc. were discussed with the patient. The patient expressed understanding and consented to the procedure. I confirmed that the patient has no bleeding disorders and is not taking blood thinners. I confirmed the patient's last platelet count with the nurse. A time-out was performed immediately prior to the procedure. Please see nursing documentation for vital signs. Sterile technique was used throughout the whole procedure. Once LOR achieved, the epidural catheter threaded easily without resistance. Aspiration of the catheter was negative for blood and CSF. The epidural was dosed slowly and an infusion was started.  2 attempt(s)Reason for block:procedure for pain

## 2024-08-01 ENCOUNTER — Other Ambulatory Visit (HOSPITAL_COMMUNITY): Payer: Self-pay

## 2024-08-01 ENCOUNTER — Encounter: Admitting: Women's Health

## 2024-08-01 ENCOUNTER — Other Ambulatory Visit

## 2024-08-01 MED ORDER — DIBUCAINE (PERIANAL) 1 % EX OINT: 1.0000 | TOPICAL_OINTMENT | CUTANEOUS | Status: DC | PRN

## 2024-08-01 MED ORDER — FERROUS SULFATE 325 (65 FE) MG PO TABS
325.0000 mg | ORAL_TABLET | ORAL | Status: DC
  Administered 2024-08-01: 325 mg via ORAL
  Filled 2024-08-01: qty 1

## 2024-08-01 MED ORDER — FERROUS SULFATE 325 (65 FE) MG PO TABS
325.0000 mg | ORAL_TABLET | ORAL | 0 refills | Status: DC
  Filled 2024-08-01: qty 45, 90d supply, fill #0

## 2024-08-01 MED ORDER — ACETAMINOPHEN 325 MG PO TABS
650.0000 mg | ORAL_TABLET | ORAL | 0 refills | Status: AC | PRN
  Filled 2024-08-01: qty 30, 3d supply, fill #0

## 2024-08-01 MED ORDER — COCONUT OIL OIL: 1.0000 | TOPICAL_OIL | Status: DC | PRN

## 2024-08-01 MED ORDER — BENZOCAINE-MENTHOL 20-0.5 % EX AERO: 1.0000 | INHALATION_SPRAY | CUTANEOUS | Status: DC | PRN

## 2024-08-01 MED ORDER — NORETHINDRONE 0.35 MG PO TABS
1.0000 | ORAL_TABLET | Freq: Every day | ORAL | 11 refills | Status: AC
Start: 2024-08-01 — End: ?
  Filled 2024-08-01: qty 28, 28d supply, fill #0

## 2024-08-01 MED ORDER — SENNOSIDES-DOCUSATE SODIUM 8.6-50 MG PO TABS
2.0000 | ORAL_TABLET | Freq: Every day | ORAL | 0 refills | Status: DC
  Filled 2024-08-01: qty 30, 15d supply, fill #0

## 2024-08-01 MED ORDER — IBUPROFEN 600 MG PO TABS
600.0000 mg | ORAL_TABLET | Freq: Four times a day (QID) | ORAL | 0 refills | Status: DC
  Filled 2024-08-01: qty 30, 8d supply, fill #0

## 2024-08-01 MED ORDER — WITCH HAZEL-GLYCERIN EX PADS: 1.0000 | MEDICATED_PAD | CUTANEOUS | Status: AC | PRN

## 2024-08-01 NOTE — Lactation Note (Signed)
 This note was copied from a baby's chart. Lactation Consultation Note  Patient Name: Lauren Guzman Unijb'd Date: 08/01/2024 Age:28 hours Reason for consult: Initial assessment  P2, Baby is out of the room for a circumcision. Mother states she plans to breastfeed and formula feed. Encouraged offering the breast before formula to help establish her milk supply. Feed on demand with cues.  Goal 8-12+ times per day after first 24 hrs.  Place baby STS if not cueing.  Provided volume guidelines for supplementation. Suggest calling the next time baby cues for LC to view latch.  Maternal Data Has patient been taught Hand Expression?: Yes Does the patient have breastfeeding experience prior to this delivery?: Yes How long did the patient breastfeed?:  (exclusive for one year)  Feeding Mother's Current Feeding Choice: Breast Milk and Formula Nipple Type: Slow - flow   Interventions Interventions: Breast feeding basics reviewed;Education;LC Services brochure;CDC milk storage guidelines  Discharge Pump: Personal;Hands Free;DEBP  Consult Status Consult Status: Follow-up Date: 08/01/24 Follow-up type: In-patient    Shannon Dines [facility] 08/01/2024, 8:22 AM

## 2024-08-01 NOTE — Anesthesia Postprocedure Evaluation (Signed)
 Anesthesia Post Note  Patient: Lauren Guzman  Procedure(s) Performed: AN AD HOC LABOR EPIDURAL     Patient location during evaluation: Mother Baby Anesthesia Type: Epidural Level of consciousness: awake and alert and oriented Pain management: satisfactory to patient Vital Signs Assessment: post-procedure vital signs reviewed and stable Respiratory status: respiratory function stable Cardiovascular status: stable Postop Assessment: no headache, no backache, epidural receding, patient able to bend at knees, no signs of nausea or vomiting, adequate PO intake and able to ambulate Anesthetic complications: no   No notable events documented.  Last Vitals:  Vitals:   08/01/24 0100 08/01/24 0555  BP: 116/69 110/65  Pulse: (!) 102 99  Resp: 18 17  Temp: 36.9 C 36.9 C  SpO2: 100% 99%    Last Pain:  Vitals:   08/01/24 0555  TempSrc: Oral  PainSc: 0-No pain   Pain Goal:                   Taiyo Kozma

## 2024-08-01 NOTE — Progress Notes (Signed)
 POSTPARTUM PROGRESS NOTE Postpartum Day 1  Subjective: Lauren Guzman is a 28 y.o. [id number] s/p NVD at [redacted]w[redacted]d.  She reports she is doing well. No acute events overnight. She denies any problems with ambulating, voiding or po intake. Denies nausea or vomiting.  Pain is well controlled.  Lochia is appropriate.  Objective: Blood pressure 110/65, pulse 99, temperature 98.4 F (36.9 C), temperature source Oral, resp. rate 17, height 5' 2 (1.575 m), weight 110.6 kg, SpO2 99%, unknown if currently breastfeeding.  Physical Exam:  General: alert, cooperative and no distress Heart:regular rate Resp: nonlabored Uterine Fundus: firm, appropriately tender Extremities:  Trace edema Skin: warm, dry  Recent Labs    07/31/24 0825  HGB 10.9*  HCT 33.5*    Assessment/Plan: Lauren Guzman is a 28 y.o. [id number] s/p NVDat [redacted]w[redacted]d   PPD#1 - meeting milestones - continue routine postpartum care - po iron q2d   Feeding: breast Contraception: none  Dispo: Plan for discharge PPD2.  LOS: 1 day   Barabara Maier, DO FM-OB Fellow [facility] [employer]

## 2024-08-01 NOTE — Progress Notes (Signed)
 MOB was referred for history of anxiety.  * Referral screened out by Clinical Social Worker because none of the following criteria appear to apply:  ~ History of anxiety during this pregnancy, or of post-partum depression following prior delivery.  ~ Diagnosis of anxiety within last 3 years  Per OB notes, MOB did not indicate any signs/symptoms during her pregnancy.  Anxiety 2015  Edinburgh score 0  OR  * MOB's symptoms currently being treated with medication and/or therapy.  Please contact the Clinical Social Worker if needs arise, by [facility] request, or if MOB scores greater than 9/yes to question 10 on Edinburgh Postpartum Depression Screen.  Rosina Molt, [place] Clinical Social Worker [phone]

## 2024-08-01 NOTE — Patient Instructions (Signed)

## 2024-08-03 LAB — BIRTH TISSUE RECOVERY COLLECTION (PLACENTA DONATION)

## 2024-08-08 ENCOUNTER — Other Ambulatory Visit

## 2024-08-08 ENCOUNTER — Encounter: Admitting: Women's Health

## 2024-08-10 ENCOUNTER — Telehealth (HOSPITAL_COMMUNITY): Payer: Self-pay | Admitting: *Deleted

## 2024-08-10 NOTE — Telephone Encounter (Signed)
 08/10/2024  Name: Lauren Guzman MRN: [id number] DOB: [birth year]  Reason for Call:  Transition of Care Hospital Discharge Call  Contact Status: Patient Contact Status: Complete  Language assistant needed:          Follow-Up Questions: Do You Have Any Concerns About Your Health As You Heal From Delivery?: No Do You Have Any Concerns About Your Infants Health?: No  Edinburgh Postnatal Depression Scale:  In the Past 7 Days:   Patient reported that her answers are the same as when she completed the EPDS in the hospital. Score at that time was 0. Stated that she is "doing well" and that she has good support. EPDS not completed at this time PHQ2-9 Depression Scale:     Discharge Follow-up: Edinburgh score requires follow up?: N/A Patient was advised of the following resources:: Breastfeeding Support Group, Support Group Requested email information - sent by RN Post-discharge interventions: Reviewed Newborn Safe Sleep Practices  Signature Allean IVAR Carton, RN, 08/10/24, [phone]

## 2024-08-29 ENCOUNTER — Ambulatory Visit: Admitting: Advanced Practice Midwife

## 2024-09-21 ENCOUNTER — Ambulatory Visit: Admitting: Advanced Practice Midwife

## 2024-09-21 ENCOUNTER — Encounter: Payer: Self-pay | Admitting: Advanced Practice Midwife

## 2024-09-21 NOTE — Patient Instructions (Addendum)
 Use the website www.postpartum.net for helpful postpartum resources! Also use the site Lact Med for help with medication safety with breastfeeding.

## 2024-09-21 NOTE — Progress Notes (Signed)
 "  POSTPARTUM VISIT Patient name: Lauren Guzman MRN [id number]  Date of birth: April 15, 1996 Chief Complaint:   Postpartum Care  History of Present Illness:   Lauren Guzman is a 29 y.o. G42P2002 African American female being seen today for a postpartum visit. She is 7 weeks postpartum following a spontaneous vaginal delivery at 39.0 gestational weeks. IOL: yes, for obesity BMI >40. Anesthesia: epidural.  Laceration: 1st deg and periurethral.  Complications: none. Inpatient contraception: no.   Pregnancy complicated by hx of gHTN w prev pregnancy. Tobacco use: no. Substance use disorder: no. Last pap smear: May 2025 and results were NILM w/ HRHPV positive: other (not 16, 18/45). Next pap smear due: May 2026 No LMP recorded (lmp unknown).  Postpartum course has been uncomplicated. Bleeding none. Bowel function is normal. Bladder function is normal. Urinary incontinence? no, fecal incontinence? no Patient is not sexually active. Last sexual activity: prior to birth of baby. Desired contraception: abstinence; may consider POPs. Patient does not know about a pregnancy in the future.  Desired family size is unsure number of children.   Upstream - 09/21/24 0949       Pregnancy Intention Screening   Does the patient want to become pregnant in the next year? No    Does the patient's partner want to become pregnant in the next year? No    Would the patient like to discuss contraceptive options today? No      Contraception Wrap Up   Current Method Abstinence    End Method Oral Contraceptive    Contraception Counseling Provided Yes         The pregnancy intention screening data noted above was reviewed. Potential methods of contraception were discussed. The patient elected to proceed with Oral Contraceptive.  Edinburgh Postpartum Depression Screening: negative  Edinburgh Postnatal Depression Scale - 09/21/24 0952       Edinburgh Postnatal Depression Scale:  In the Past 7 Days   I have been able to  laugh and see the funny side of things. 0    I have looked forward with enjoyment to things. 0    I have blamed myself unnecessarily when things went wrong. 0    I have been anxious or worried for no good reason. 0    I have felt scared or panicky for no good reason. 0    Things have been getting on top of me. 1    I have been so unhappy that I have had difficulty sleeping. 0    I have felt sad or miserable. 0    I have been so unhappy that I have been crying. 0    The thought of harming myself has occurred to me. 0    Edinburgh Postnatal Depression Scale Total 1             01/26/2024   10:01 AM 11/11/2022    9:18 AM 07/29/2022    2:43 PM  GAD 7 : Generalized Anxiety Score  Nervous, Anxious, on Edge 0 0 0  Control/stop worrying 0 0 0  Worry too much - different things 0 0 0  Trouble relaxing 0 0 0  Restless 0 0 0  Easily annoyed or irritable 0 0 1  Afraid - awful might happen 0 0 0  Total GAD 7 Score 0 0 1     Baby's course has been uncomplicated. Baby is feeding by breast: milk supply adequate. Infant has a pediatrician/family doctor? Yes.  Childcare strategy if returning to  work/school: n/a-stay at home mom.  Pt has material needs met for her and baby: Yes.   Review of Systems:   Pertinent items are noted in HPI Denies Abnormal vaginal discharge w/ itching/odor/irritation, headaches, visual changes, shortness of breath, chest pain, abdominal pain, severe nausea/vomiting, or problems with urination or bowel movements. Pertinent History Reviewed:  Reviewed past medical,surgical, obstetrical and family history.  Reviewed problem list, medications and allergies. OB History  Gravida Para Term Preterm AB Living  2 2 2   2   SAB IAB Ectopic Multiple Live Births     0 2    # Outcome Date GA Lbr Len/2nd Weight Sex Type Anes PTL Lv  2 Term 07/31/24 [redacted]w[redacted]d 04:36 / 00:06 6 lb 13.7 oz (3.11 kg) M Vag-Spont EPI  LIV  1 Term 02/03/23 [redacted]w[redacted]d 12:40 / 00:47 6 lb 5.2 oz (2.87 kg) M Vag-Spont  EPI  LIV     Complications: Gestational hypertension   Physical Assessment:   Vitals:   09/21/24 0948  BP: 110/77  Pulse: (!) 106  Weight: 220 lb (99.8 kg)  Height: 5' 3 (1.6 m)  Body mass index is 38.97 kg/m.       Physical Examination:   General appearance: alert, well appearing, and in no distress  Mental status: alert, oriented to person, place, and time  Skin: warm & dry   Cardiovascular: normal heart rate noted   Respiratory: normal respiratory effort, no distress   Breasts: deferred, no complaints   Abdomen: soft, non-tender   Pelvic: examination not indicated. Thin prep pap obtained: No  Rectal: not examined  Extremities: Edema: none         No results found for this or any previous visit (from the past 24 hours).  Assessment & Plan:  1) Postpartum exam 2) Seven wks s/p spontaneous vaginal delivery 3) breast feeding 4) Depression screening 5) Contraception counseling  Essential components of care per ACOG recommendations:  1.  Mood and well being:  If positive depression screen, discussed and plan developed.  If using tobacco we discussed reduction/cessation and risk of relapse If current substance abuse, we discussed and referral to local resources was offered.   2. Infant care and feeding:  If breastfeeding, discussed returning to work, pumping, breastfeeding-associated pain, guidance regarding return to fertility while lactating if not using another method. If needed, patient was provided with a letter to be allowed to pump q 2-3hrs to support lactation in a private location with access to a refrigerator to store breastmilk.   Recommended that all caregivers be immunized for flu, pertussis and other preventable communicable diseases If pt does not have material needs met for her/baby, referred to local resources for help obtaining these.  3. Sexuality, contraception and birth spacing Provided guidance regarding sexuality, management of dyspareunia, and  resumption of intercourse Discussed avoiding interpregnancy interval <19mths and recommended birth spacing of 18 months  4. Sleep and fatigue Discussed coping options for fatigue and sleep disruption Encouraged family/partner/community support of 4 hrs of uninterrupted sleep to help with mood and fatigue  5. Physical recovery  If pt had a C/S, assessed incisional pain and providing guidance on normal vs prolonged recovery If pt had a laceration, perineal healing and pain reviewed.  If urinary or fecal incontinence, discussed management and referred to PT or uro/gyn if indicated  Patient is safe to resume physical activity. Discussed attainment of healthy weight.  6.  Chronic disease management Discussed pregnancy complications if any, and their implications for future  childbearing and long-term maternal health. Review recommendations for prevention of recurrent pregnancy complications, such as 17 hydroxyprogesterone caproate to reduce risk for recurrent PTB not applicable, or aspirin  to reduce risk of preeclampsia yes. Pt had GDM: no. If yes, 2hr GTT scheduled: not applicable. Reviewed medications and non-pregnant dosing including consideration of whether pt is breastfeeding using a reliable resource such as LactMed: yes Referred for f/u w/ PCP or subspecialist providers as indicated: not applicable  7. Health maintenance Mammogram at 29yo or earlier if indicated Pap smears as indicated  Meds: No orders of the defined types were placed in this encounter.   Follow-up: Return for Pap & Physical May 2026.   No orders of the defined types were placed in this encounter.   Suzen JONETTA Gentry CNM 09/21/2024 9:58 AM   "
